# Patient Record
Sex: Female | Born: 1955 | Hispanic: No | Marital: Single | Smoking: Never smoker
Health system: Southern US, Community
[De-identification: ages and names within clinical notes are randomized; demographics above are authoritative.]

## PROBLEM LIST (undated history)

## (undated) DIAGNOSIS — R51 Headache: Secondary | ICD-10-CM

## (undated) DIAGNOSIS — E559 Vitamin D deficiency, unspecified: Secondary | ICD-10-CM

## (undated) DIAGNOSIS — Z8719 Personal history of other diseases of the digestive system: Secondary | ICD-10-CM

## (undated) DIAGNOSIS — C439 Malignant melanoma of skin, unspecified: Secondary | ICD-10-CM

## (undated) DIAGNOSIS — M545 Low back pain, unspecified: Secondary | ICD-10-CM

## (undated) DIAGNOSIS — R519 Headache, unspecified: Secondary | ICD-10-CM

## (undated) DIAGNOSIS — T4145XA Adverse effect of unspecified anesthetic, initial encounter: Secondary | ICD-10-CM

## (undated) DIAGNOSIS — K219 Gastro-esophageal reflux disease without esophagitis: Secondary | ICD-10-CM

## (undated) DIAGNOSIS — R12 Heartburn: Secondary | ICD-10-CM

## (undated) DIAGNOSIS — F419 Anxiety disorder, unspecified: Secondary | ICD-10-CM

## (undated) DIAGNOSIS — M51369 Other intervertebral disc degeneration, lumbar region without mention of lumbar back pain or lower extremity pain: Secondary | ICD-10-CM

## (undated) DIAGNOSIS — R011 Cardiac murmur, unspecified: Secondary | ICD-10-CM

## (undated) DIAGNOSIS — R319 Hematuria, unspecified: Secondary | ICD-10-CM

## (undated) DIAGNOSIS — Z9289 Personal history of other medical treatment: Secondary | ICD-10-CM

## (undated) DIAGNOSIS — G4700 Insomnia, unspecified: Secondary | ICD-10-CM

## (undated) DIAGNOSIS — M542 Cervicalgia: Secondary | ICD-10-CM

## (undated) DIAGNOSIS — T8859XA Other complications of anesthesia, initial encounter: Secondary | ICD-10-CM

## (undated) DIAGNOSIS — M5136 Other intervertebral disc degeneration, lumbar region: Secondary | ICD-10-CM

## (undated) DIAGNOSIS — N83209 Unspecified ovarian cyst, unspecified side: Secondary | ICD-10-CM

## (undated) DIAGNOSIS — I10 Essential (primary) hypertension: Secondary | ICD-10-CM

## (undated) HISTORY — DX: Personal history of other medical treatment: Z92.89

## (undated) HISTORY — DX: Insomnia, unspecified: G47.00

## (undated) HISTORY — DX: Cervicalgia: M54.2

## (undated) HISTORY — DX: Anxiety disorder, unspecified: F41.9

## (undated) HISTORY — PX: OTHER SURGICAL HISTORY: SHX169

## (undated) HISTORY — DX: Low back pain: M54.5

## (undated) HISTORY — PX: ECTOPIC PREGNANCY SURGERY: SHX613

## (undated) HISTORY — DX: Unspecified ovarian cyst, unspecified side: N83.209

## (undated) HISTORY — DX: Other intervertebral disc degeneration, lumbar region: M51.36

## (undated) HISTORY — DX: Other intervertebral disc degeneration, lumbar region without mention of lumbar back pain or lower extremity pain: M51.369

## (undated) HISTORY — DX: Low back pain, unspecified: M54.50

## (undated) HISTORY — DX: Hematuria, unspecified: R31.9

## (undated) HISTORY — DX: Heartburn: R12

## (undated) HISTORY — DX: Vitamin D deficiency, unspecified: E55.9

---

## 1980-08-02 HISTORY — PX: SALPINGECTOMY: SHX328

## 2004-08-05 ENCOUNTER — Ambulatory Visit: Payer: Self-pay | Admitting: Otolaryngology

## 2005-03-17 ENCOUNTER — Ambulatory Visit: Payer: Self-pay | Admitting: Family Medicine

## 2005-03-31 ENCOUNTER — Ambulatory Visit: Payer: Self-pay | Admitting: Family Medicine

## 2005-05-24 ENCOUNTER — Ambulatory Visit: Payer: Self-pay | Admitting: Urology

## 2006-06-28 ENCOUNTER — Ambulatory Visit: Payer: Self-pay

## 2007-01-04 ENCOUNTER — Ambulatory Visit: Payer: Self-pay | Admitting: Internal Medicine

## 2007-03-30 ENCOUNTER — Ambulatory Visit: Payer: Self-pay | Admitting: Emergency Medicine

## 2007-07-19 ENCOUNTER — Ambulatory Visit: Payer: Self-pay | Admitting: Family Medicine

## 2007-10-22 ENCOUNTER — Ambulatory Visit: Payer: Self-pay | Admitting: Internal Medicine

## 2008-01-23 ENCOUNTER — Ambulatory Visit: Payer: Self-pay | Admitting: Unknown Physician Specialty

## 2008-07-09 ENCOUNTER — Ambulatory Visit: Payer: Self-pay | Admitting: Internal Medicine

## 2009-01-16 ENCOUNTER — Ambulatory Visit: Payer: Self-pay | Admitting: Internal Medicine

## 2009-03-18 ENCOUNTER — Ambulatory Visit: Payer: Self-pay | Admitting: Internal Medicine

## 2009-08-04 ENCOUNTER — Ambulatory Visit: Payer: Self-pay | Admitting: Family Medicine

## 2010-07-21 ENCOUNTER — Ambulatory Visit: Payer: Self-pay | Admitting: Family Medicine

## 2010-12-02 ENCOUNTER — Ambulatory Visit: Payer: Self-pay | Admitting: Family Medicine

## 2011-02-19 ENCOUNTER — Ambulatory Visit: Payer: Self-pay | Admitting: Family Medicine

## 2011-06-17 ENCOUNTER — Ambulatory Visit: Payer: Self-pay | Admitting: Internal Medicine

## 2011-10-19 ENCOUNTER — Emergency Department: Payer: Self-pay | Admitting: Emergency Medicine

## 2012-01-11 ENCOUNTER — Ambulatory Visit: Payer: Self-pay | Admitting: Internal Medicine

## 2012-04-06 ENCOUNTER — Ambulatory Visit: Payer: Self-pay | Admitting: Family Medicine

## 2012-07-13 ENCOUNTER — Ambulatory Visit: Payer: Self-pay

## 2012-07-13 ENCOUNTER — Observation Stay: Payer: Self-pay | Admitting: Internal Medicine

## 2012-07-13 LAB — COMPREHENSIVE METABOLIC PANEL
Alkaline Phosphatase: 76 U/L (ref 50–136)
Anion Gap: 5 — ABNORMAL LOW (ref 7–16)
BUN: 13 mg/dL (ref 7–18)
Calcium, Total: 8.7 mg/dL (ref 8.5–10.1)
Chloride: 106 mmol/L (ref 98–107)
Co2: 28 mmol/L (ref 21–32)
EGFR (African American): >60
EGFR (Non-African Amer.): >60
Glucose: 91 mg/dL (ref 65–99)
Potassium: 3.6 mmol/L (ref 3.5–5.1)
SGOT(AST): 29 U/L (ref 15–37)
SGPT (ALT): 32 U/L (ref 12–78)

## 2012-07-13 LAB — CBC WITH DIFFERENTIAL/PLATELET
Basophil %: 0.7 %
Eosinophil %: 4.2 %
HGB: 13.2 g/dL (ref 12.0–16.0)
Lymphocyte #: 4.3 10*3/uL — ABNORMAL HIGH (ref 1.0–3.6)
Lymphocyte %: 44.1 %
MCH: 28.7 pg (ref 26.0–34.0)
Monocyte #: 0.8 x10 3/mm (ref 0.2–0.9)
Monocyte %: 8.7 %
Neutrophil #: 4.1 10*3/uL (ref 1.4–6.5)
Neutrophil %: 42.3 %
Platelet: 298 10*3/uL (ref 150–440)
RBC: 4.6 10*6/uL (ref 3.80–5.20)
WBC: 9.7 10*3/uL (ref 3.6–11.0)

## 2012-07-13 LAB — TROPONIN I: Troponin-I: <0.02 ng/mL

## 2012-07-13 LAB — CK TOTAL AND CKMB (NOT AT ARMC)
CK, Total: 87 U/L (ref 21–215)
CK-MB: 0.7 ng/mL (ref 0.5–3.6)

## 2012-07-14 LAB — TROPONIN I: Troponin-I: <0.02 ng/mL

## 2012-09-20 DIAGNOSIS — M75100 Unspecified rotator cuff tear or rupture of unspecified shoulder, not specified as traumatic: Secondary | ICD-10-CM | POA: Insufficient documentation

## 2013-02-09 ENCOUNTER — Ambulatory Visit: Payer: Self-pay | Admitting: General Practice

## 2013-05-31 DIAGNOSIS — I10 Essential (primary) hypertension: Secondary | ICD-10-CM | POA: Insufficient documentation

## 2013-06-05 ENCOUNTER — Ambulatory Visit: Payer: Self-pay | Admitting: Family Medicine

## 2013-12-12 DIAGNOSIS — E559 Vitamin D deficiency, unspecified: Secondary | ICD-10-CM | POA: Insufficient documentation

## 2014-01-24 ENCOUNTER — Ambulatory Visit: Payer: Self-pay | Admitting: Physician Assistant

## 2014-05-10 ENCOUNTER — Ambulatory Visit: Payer: Self-pay | Admitting: Orthopedic Surgery

## 2014-05-30 DIAGNOSIS — Z9289 Personal history of other medical treatment: Secondary | ICD-10-CM

## 2014-05-30 HISTORY — DX: Personal history of other medical treatment: Z92.89

## 2014-05-30 LAB — HM PAP SMEAR: HM PAP: NEGATIVE

## 2014-06-30 ENCOUNTER — Ambulatory Visit: Payer: Self-pay | Admitting: Physician Assistant

## 2014-06-30 LAB — RAPID STREP-A WITH REFLX: Micro Text Report: NEGATIVE

## 2014-06-30 LAB — RAPID INFLUENZA A&B ANTIGENS

## 2014-07-03 LAB — BETA STREP CULTURE(ARMC)

## 2014-07-24 ENCOUNTER — Ambulatory Visit: Payer: Self-pay | Admitting: Family Medicine

## 2014-07-30 ENCOUNTER — Ambulatory Visit: Payer: Self-pay | Admitting: Family Medicine

## 2014-08-02 HISTORY — PX: NECK SURGERY: SHX720

## 2014-09-16 ENCOUNTER — Ambulatory Visit: Payer: Self-pay

## 2014-11-19 NOTE — Consult Note (Signed)
PATIENT NAME:  Emily Burgess, Emily Burgess MR#:  [id number] DATE OF BIRTH:  05-17-1956  DATE OF CONSULTATION:  07/13/2012  REFERRING PHYSICIAN:  Dr. Harrel Lemon  CONSULTING PHYSICIAN:  Corey Skains, MD  PRIMARY CARE PHYSICIAN: Dr. Ronnald Ramp  REASON FOR CONSULTATION: Unstable angina, bradycardia and hypertension.   CHIEF COMPLAINT: "I have chest pain."   HISTORY OF PRESENT ILLNESS: This is a 59 year old female with hypertension who has had new onset of substernal chest discomfort radiating into her back and left arm multiple times in the last many days for which she is also awakened this morning with chest pain, shortness of breath and weakness and dizziness. The patient has had relief of that at this time. EKG has shown sinus bradycardia, otherwise normal EKG. The patient has had reasonable blood pressure at this time. Troponin, CK-MB so far are within normal limits. There has been no other cardiovascular symptoms at this time.   REVIEW OF SYSTEMS: Reminder review of systems negative for vision change, ringing in the ears, hearing loss, cough, congestion, heartburn, nausea, vomiting, diarrhea, bloody stool, stomach pain, extremity pain, leg weakness, cramping of the buttocks, known blood clots, headaches, blackouts, nosebleeds, congestion, trouble swallowing, frequent urination, urination at night, muscle weakness, numbness, anxiety, depression, skin lesions, skin rashes.   PAST MEDICAL HISTORY:  1. Hypertension.  2. Hyperlipidemia.   FAMILY HISTORY: Father had some coronary disease at an early age.   SOCIAL HISTORY: Currently denies alcohol or tobacco use.   ALLERGIES: No known drug allergies.   CURRENT MEDICATIONS: As listed.   PHYSICAL EXAMINATION:  VITAL SIGNS: Blood pressure 139/68 bilaterally, heart rate is 58 upright, reclining, and regular.   GENERAL: She is a well appearing female in no acute distress.   HEENT: No icterus, thyromegaly, ulcers, hemorrhage, or xanthelasma.    CARDIOVASCULAR: Regular rate and rhythm with normal S1 and S2 without murmur, gallop, or rub. Point of maximal impulse is normal size and placement. Carotid upstroke normal without bruit. Jugular venous pressure normal.   LUNGS: Lungs have few basilar crackles with normal respirations.   ABDOMEN: Soft, nontender without hepatosplenomegaly or masses. Abdominal aorta is normal size without bruit.   EXTREMITIES: 2+ bilateral pulses in dorsal, pedal, radial, and femoral arteries without lower extremity edema, cyanosis, clubbing, ulcers.   NEUROLOGIC: She is oriented to time, place, and person with normal mood and affect.   ASSESSMENT: 59 year old female with hypertension, hyperlipidemia, new onset of chest pain, shortness of breath without evidence of myocardial infarction consistent with unstable angina and bradycardia needing further treatment options.   RECOMMENDATIONS:  1. Decrease dose of atenolol to reduce concerns of bradycardia.  2. Continue hypertension control with lisinopril.  3. Serial ECG and enzymes to assess for myocardial infarction.  4. Treadmill EKG to assess exercise tolerance, myocardial ischemia.  5. Continue aspirin for further risk reduction and potentially use nitroglycerin as necessary.  6. Further treatment options after above.   ____________________________ Corey Skains, MD bjk:cms D: 07/13/2012 17:00:54 ET T: 07/14/2012 06:54:35 ET  JOB#: [id number] cc: Corey Skains, MD, <Dictator>  Corey Skains MD ELECTRONICALLY SIGNED 07/25/2012 7:48

## 2014-11-19 NOTE — H&P (Signed)
PATIENT NAME:  Emily Burgess, Emily Burgess MR#:  [id number] DATE OF BIRTH:  1956-04-29  DATE OF ADMISSION:  07/13/2012  CHIEF COMPLAINT: Dizziness and chest pain.   HISTORY OF PRESENT ILLNESS: This is a 59 year old female who yesterday saw her primary care doctor for a routine check-up. She did mention to her primary care doctor that she had had chest pain during the week with numbness down her left arm and some shortness of breath. Since then she also had some dizziness episodes where she felt lightheaded like she was going to pass out, complained of palpitations during that time. EKG done in the office showed some possible abnormalities and some T waves. She was set up to see Dr. Nehemiah Massed today, but before that she had three episodes today of having palpitations, lightheadedness and presyncopal-type feelings. Here in the ER, her EKG is unremarkable, but with this symptomatology for concern of either coronary artery disease or some type of arrhythmia, so we are going to observe her.   PAST MEDICAL HISTORY: Hypertension.   PAST SURGICAL HISTORY: None.   ALLERGIES: No known drug allergies.   CURRENT MEDICATIONS:  1. Atenolol 100 mg daily.  2. Hydrochlorothiazide 25 mg daily.   SOCIAL HISTORY: Does not smoke or drink alcohol.   FAMILY HISTORY: Significant for father who had a myocardial infarction in his 69s and hypertension.   REVIEW OF SYSTEMS: CONSTITUTIONAL: No fever or chills. EYES: No blurred vision. ENT: No hearing loss. CARDIOVASCULAR: No chest pain. PULMONARY: No shortness of breath. GI: No nausea, vomiting, or diarrhea. GU: No dysuria. ENDOCRINE: No heat or cold intolerance. INTEGUMENT: No rash. MUSCULOSKELETAL: Occasional joint pain. NEUROLOGIC: No numbness or weakness.   PHYSICAL EXAMINATION:   VITAL SIGNS: Blood pressure is 122/67, temperature 98.1, pulse 59, and respirations 16.   GENERAL: This is a well nourished white female in no acute distress.   HEENT: The pupils are equal, round, and  reactive to light. Sclerae anicteric. Oral mucosa is moist. Oropharynx is clear. Nasopharynx is clear.   NECK: Supple. No JVD, lymphadenopathy, or thyromegaly.   CARDIOVASCULAR: Regular rate and rhythm. No murmurs, rubs, or gallops.   LUNGS: Clear to auscultation. No dullness to percussion. She is not using accessory muscles.   ABDOMEN: Soft, nontender, and nondistended. Bowel sounds are positive. No hepatosplenomegaly. No masses.   EXTREMITIES: There is no edema.   NEUROLOGIC: Cranial nerves II through XII are intact. She is alert and oriented x 4.   SKIN: Moist with no rash.   LABS/STUDIES: EKG shows normal sinus rhythm.  Troponin is less than 0.2.   ASSESSMENT AND PLAN:  1. Chest pain. She did have this episode yesterday. EKG is unremarkable, but would have to be suspicious with this other symptomatology about some possible coronary artery disease. We will go ahead and give her aspirin, continue her beta blocker and rule her out. Consider some type of functional study in the morning.  2. Presyncope. I am suspicious this could be an arrhythmia so would like to keep her on a monitor under observation for 24 hours. We will consult cardiology for possible stress EKG with treadmill in the morning.  3. Hypertension. We will continue her current medications.  4. Shortness of breath. Suspect this could be cardiac in nature; however, if this rules out we will consider some type of pulmonary work-up as an outpatient.   TIME SPENT ON ADMISSION: 45 minutes. ____________________________ Baxter Hire, MD jdj:slb D: 07/13/2012 16:35:53 ET T: 07/13/2012 17:32:59 ET JOB#: [id number]  cc: Baxter Hire, MD, <Dictator> Juline Patch, MD Corey Skains, MD Baxter Hire MD ELECTRONICALLY SIGNED 07/14/2012 14:59

## 2014-11-19 NOTE — Discharge Summary (Signed)
PATIENT NAME:  Emily Burgess, Emily Burgess MR#:  [id number] DATE OF BIRTH:  [birth year]  DATE OF ADMISSION:  07/13/2012 DATE OF DISCHARGE:  07/14/2012  PRIMARY CARE PHYSICIAN:  Dr. Otilio Miu  DISCHARGE DIAGNOSES:  1. Chest pain, negative stress test.  2. Hypertension.   HISTORY OF PRESENTING ILLNESS: The patient is a 59 year old female, saw her primary care physician on the previous day for a routine checkup. She did mention to her primary care doctor that she had chest pain during the week with numbness down her left arm and some shortness of breath then. She also had some dizziness episode where she felt lightheaded and was going to pass out. She complained of palpitations during that time. EKG was done in the office and showed some possible abnormalities in the T waves. She was set up to see Dr. Nehemiah Massed, but before that she had three episodes of having palpitations, lightheadedness and presyncopal-type feeling; and so she was sent in the Emergency Room. EKG was unremarkable, and so there was concern of having either arrhythmia or coronary artery disease, and so she was admitted for observation on telemetry floor.   HOSPITAL COURSE: Chest pain. She was started on aspirin, beta blocker, and to do further study next day morning. For presyncopal episode and possibility of arrhythmia, she was monitored on telemetry monitoring and cardiology consult to be done in the morning. For 24 hours on monitoring on telemetry, she remained without any event. Her troponins remained negative, and a stress test was done as per the recommendation by Cardiology in consult, and that was negative. So, she was discharged home with advise to follow up with her primary care physician and reassure that there is no significant coronary artery disease.   Other issue addressed during the hospital stay was hypertension. She was continued on her home medication, lisinopril, and we decreased the dose of atenolol and she was found having some  bradycardia in the range of 50, and she was advised to take decreased dose of atenolol than what she was taking at home.   Important lab results during the hospital stay: All three troponins remained negative, less than 0.02. Chest x-ray: No acute cardiopulmonary disease. BUN 13, creatinine 0.79, glucose 91, potassium 3.6.    Stress test was done and was reviewed by Dr. Nehemiah Massed, cardiologist, who did the consult on the patient; and after this was done, he informs in his notes that the stress test is without any ischemic changes or chest pain. So, from a cardiac point of view he advised her to go home with decreased dose of atenolol and follow up in 2 weeks.   CONSULTS: Cardiology consult was done with Dr. Nehemiah Massed.   CONDITION ON DISCHARGE: Stable.   CODE STATUS:  FULL CODE.    DISCHARGE MEDICATIONS: Lisinopril 10 mg daily, atenolol 50 mg daily.   DIET: Advised low sodium and regular consistency.   ACTIVITY LIMITATION: None.   TIMEFRAME TO FOLLOW UP: One to 2 weeks with Otilio Miu, family physician, [place], [address] [place], [place].   TOTAL TIME SPENT IN DISCHARGE: 45 minutes  ____________________________ Ceasar Lund. Anselm Jungling, MD vgv:cb D: 07/16/2012 18:41:37 ET T: 07/17/2012 14:07:20 ET JOB#: [id number]  cc: Ceasar Lund. Anselm Jungling, MD, <Dictator> Juline Patch, MD Vaughan Basta MD ELECTRONICALLY SIGNED 07/17/2012 23:01

## 2015-01-23 ENCOUNTER — Other Ambulatory Visit: Payer: Self-pay | Admitting: Family Medicine

## 2015-01-23 DIAGNOSIS — R3129 Other microscopic hematuria: Secondary | ICD-10-CM

## 2015-01-30 ENCOUNTER — Other Ambulatory Visit: Payer: Self-pay

## 2015-02-06 ENCOUNTER — Ambulatory Visit
Admission: RE | Admit: 2015-02-06 | Discharge: 2015-02-06 | Disposition: A | Discharge status: Home / Self Care | Payer: PRIVATE HEALTH INSURANCE | Source: Ambulatory Visit | Attending: Family Medicine | Admitting: Family Medicine

## 2015-02-06 DIAGNOSIS — R312 Other microscopic hematuria: Secondary | ICD-10-CM | POA: Insufficient documentation

## 2015-02-06 DIAGNOSIS — R3129 Other microscopic hematuria: Secondary | ICD-10-CM

## 2015-02-06 HISTORY — DX: Malignant melanoma of skin, unspecified: C43.9

## 2015-02-06 HISTORY — DX: Essential (primary) hypertension: I10

## 2015-02-06 MED ORDER — IOHEXOL 300 MG/ML  SOLN
125.0000 mL | Freq: Once | INTRAMUSCULAR | Status: AC | PRN
  Administered 2015-02-06: 125 mL via INTRAVENOUS

## 2015-06-03 DIAGNOSIS — M542 Cervicalgia: Secondary | ICD-10-CM

## 2015-06-03 HISTORY — DX: Cervicalgia: M54.2

## 2015-06-15 ENCOUNTER — Emergency Department
Admission: EM | Admit: 2015-06-15 | Discharge: 2015-06-15 | Disposition: A | Discharge status: Home / Self Care | Payer: Worker's Compensation | Attending: Emergency Medicine | Admitting: Emergency Medicine

## 2015-06-15 ENCOUNTER — Encounter: Payer: Self-pay | Admitting: Emergency Medicine

## 2015-06-15 DIAGNOSIS — Y990 Civilian activity done for income or pay: Secondary | ICD-10-CM | POA: Diagnosis not present

## 2015-06-15 DIAGNOSIS — Z7721 Contact with and (suspected) exposure to potentially hazardous body fluids: Secondary | ICD-10-CM | POA: Diagnosis present

## 2015-06-15 DIAGNOSIS — I10 Essential (primary) hypertension: Secondary | ICD-10-CM | POA: Insufficient documentation

## 2015-06-15 DIAGNOSIS — Y9389 Activity, other specified: Secondary | ICD-10-CM | POA: Diagnosis not present

## 2015-06-15 DIAGNOSIS — Y92149 Unspecified place in prison as the place of occurrence of the external cause: Secondary | ICD-10-CM | POA: Insufficient documentation

## 2015-06-15 DIAGNOSIS — S39012A Strain of muscle, fascia and tendon of lower back, initial encounter: Secondary | ICD-10-CM

## 2015-06-15 MED ORDER — IBUPROFEN 600 MG PO TABS
600.0000 mg | ORAL_TABLET | Freq: Once | ORAL | Status: AC
  Administered 2015-06-15: 600 mg via ORAL
  Filled 2015-06-15: qty 1
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
## 2015-06-15 NOTE — Discharge Instructions (Signed)
At this time, there is no evidence of significant pathogen exposure you and you have declined blood work which is not unreasonable. If you have any new or worrisome symptoms please return to the emergency department.

## 2015-06-15 NOTE — ED Notes (Signed)
NAD
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
## 2015-08-08 ENCOUNTER — Encounter: Payer: Self-pay | Admitting: Physician Assistant

## 2015-08-08 ENCOUNTER — Ambulatory Visit: Payer: Self-pay | Admitting: Physician Assistant

## 2015-08-08 VITALS — BP 110/70 | HR 77 | Temp 98.5°F

## 2015-08-08 DIAGNOSIS — M542 Cervicalgia: Secondary | ICD-10-CM

## 2015-08-08 MED ORDER — METHYLPREDNISOLONE 4 MG PO TBPK: ORAL_TABLET | ORAL | Status: DC

## 2015-08-08 MED ORDER — CYCLOBENZAPRINE HCL 10 MG PO TABS: 10.0000 mg | ORAL_TABLET | Freq: Three times a day (TID) | ORAL | Status: DC | PRN
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
## 2015-08-29 ENCOUNTER — Ambulatory Visit: Payer: Self-pay | Admitting: Physician Assistant

## 2015-08-29 ENCOUNTER — Ambulatory Visit
Admission: RE | Admit: 2015-08-29 | Discharge: 2015-08-29 | Disposition: A | Discharge status: Home / Self Care | Payer: Managed Care, Other (non HMO) | Source: Ambulatory Visit | Attending: Physician Assistant | Admitting: Physician Assistant

## 2015-08-29 ENCOUNTER — Encounter: Payer: Self-pay | Admitting: Physician Assistant

## 2015-08-29 VITALS — BP 120/70 | HR 87 | Temp 98.4°F

## 2015-08-29 DIAGNOSIS — M5030 Other cervical disc degeneration, unspecified cervical region: Secondary | ICD-10-CM | POA: Insufficient documentation

## 2015-08-29 DIAGNOSIS — M542 Cervicalgia: Secondary | ICD-10-CM

## 2015-08-29 MED ORDER — METHOCARBAMOL 750 MG PO TABS: 750.0000 mg | ORAL_TABLET | Freq: Four times a day (QID) | ORAL | Status: DC

## 2015-08-29 NOTE — Progress Notes (Signed)
S- Patient c/o 3 weeks of neck pain. Current being treated for cervical strain.  Patient  unable to take Flexeril as directed 2nd to side affects. Take Flexeril only when not working to avoid sleeping at work. O-   Moderate  distress. No spinal deformity.  Guarding with palpation at c5-6. Spasms with flexion and lateral movements. F/E ROM upper extremities. A- Acute neck pain. P- Discontinue  Flexeril, start Robaxin.  C-Spine X-ray.  Follow up in 2-3 days.

## 2015-09-12 ENCOUNTER — Other Ambulatory Visit: Payer: Self-pay | Admitting: Orthopedic Surgery

## 2015-09-12 DIAGNOSIS — M5412 Radiculopathy, cervical region: Secondary | ICD-10-CM

## 2015-09-20 ENCOUNTER — Ambulatory Visit
Admission: RE | Admit: 2015-09-20 | Discharge: 2015-09-20 | Disposition: A | Discharge status: Home / Self Care | Payer: Managed Care, Other (non HMO) | Source: Ambulatory Visit | Attending: Orthopedic Surgery | Admitting: Orthopedic Surgery

## 2015-09-20 DIAGNOSIS — M5412 Radiculopathy, cervical region: Secondary | ICD-10-CM

## 2015-10-01 DIAGNOSIS — N83209 Unspecified ovarian cyst, unspecified side: Secondary | ICD-10-CM | POA: Insufficient documentation

## 2015-10-21 ENCOUNTER — Encounter: Payer: Self-pay | Admitting: *Deleted

## 2015-10-21 ENCOUNTER — Ambulatory Visit: Payer: Managed Care, Other (non HMO) | Admitting: Urology

## 2015-10-29 ENCOUNTER — Ambulatory Visit (INDEPENDENT_AMBULATORY_CARE_PROVIDER_SITE_OTHER): Payer: Managed Care, Other (non HMO) | Admitting: Urology

## 2015-10-29 ENCOUNTER — Encounter: Payer: Self-pay | Admitting: Urology

## 2015-10-29 VITALS — BP 120/77 | HR 85 | Ht 64.0 in | Wt 222.2 lb

## 2015-10-29 DIAGNOSIS — R3129 Other microscopic hematuria: Secondary | ICD-10-CM | POA: Diagnosis not present

## 2015-10-29 DIAGNOSIS — N949 Unspecified condition associated with female genital organs and menstrual cycle: Secondary | ICD-10-CM

## 2015-10-29 LAB — URINALYSIS, COMPLETE
BILIRUBIN UA: NEGATIVE
GLUCOSE, UA: NEGATIVE
Leukocytes, UA: NEGATIVE
NITRITE UA: NEGATIVE
Protein, UA: NEGATIVE
Specific Gravity, UA: >1.03 — ABNORMAL HIGH (ref 1.005–1.030)
UUROB: 0.2 mg/dL (ref 0.2–1.0)
pH, UA: 5.5 (ref 5.0–7.5)

## 2015-10-29 LAB — MICROSCOPIC EXAMINATION

## 2015-10-29 NOTE — Progress Notes (Signed)
10/29/2015 3:40 PM   Emily Burgess July 23, 1956 [id number]  Referring provider: Gayland Curry, MD [address] [place], [place] [address]  Chief Complaint  Patient presents with  . Hematuria    referred by Dr. Astrid Divine    HPI: Patient is a 60 year old Caucasian female with a history of persistent microscopic hematuria who is referred by her PCP, Dr. Astrid Divine for further evaluation and management.  Patient states that she has been told for the last 2 years that she has blood in her urine.  She denies gross hematuria, flank/suprapubic pain, dysuria, a history of nephrolithiasis or a history of recurrent urinary tract infections.  She denies any family history of nephrolithiasis or hematuria.    She does not have a personal history of GU malignancy and states she has a cousin with prostate cancer.    Her baseline urinary symptoms are frequency and nocturia.  Her UA today remains positive for microscopic hematuria with 3-10 rbc's per high-power field.   She did undergo a CT urogram in July 2016 ordered by Dr. Iona Beard that did not find any etiology for her microscopic hematuria.  She is very concerned that she still has blood in her urine today and would like the exam repeated.   PMH: Past Medical History  Diagnosis Date  . Melanoma ([place])     10 years ago resected from mid chest area.  . Hypertension   . Insomnia   . Anxiety   . Hematuria     microscopic  . Ovarian cyst   . Heartburn     Surgical History: Past Surgical History  Procedure Laterality Date  . Atopic pregnancy    . Cesarean section  1980    Home Medications:    Medication List       This list is accurate as of: 10/29/15  3:40 PM.  Always use your most recent med list.               aspirin EC 81 MG tablet  Take by mouth.     atenolol 50 MG tablet  Commonly known as:  TENORMIN  TAKE 1 TABLET (50 MG TOTAL) BY MOUTH ONCE DAILY.     cyclobenzaprine 10 MG tablet  Commonly known as:   FLEXERIL  Take 1 tablet (10 mg total) by mouth 3 (three) times daily as needed for muscle spasms.     gabapentin 300 MG capsule  Commonly known as:  NEURONTIN  Take 300 mg by mouth every evening.     hydrOXYzine 25 MG tablet  Commonly known as:  ATARAX/VISTARIL  Reported on 10/29/2015     lisinopril-hydrochlorothiazide 10-12.5 MG tablet  Commonly known as:  PRINZIDE,ZESTORETIC  Take 1 tablet by mouth daily.     methocarbamol 750 MG tablet  Commonly known as:  ROBAXIN-750  Take 1 tablet (750 mg total) by mouth 4 (four) times daily.     methylPREDNISolone 4 MG Tbpk tablet  Commonly known as:  MEDROL DOSEPAK  See admin instructions. Reported on 10/29/2015     traZODone 50 MG tablet  Commonly known as:  DESYREL  Reported on 10/29/2015        Allergies: No Known Allergies  Family History: Family History  Problem Relation Age of Onset  . Kidney disease Neg Hx   . Bladder Cancer Neg Hx   . Prostate cancer Cousin     Social History:  reports that she has never smoked. She does not have any smokeless tobacco history on  file. She reports that she does not drink alcohol or use illicit drugs.  ROS: UROLOGY Frequent Urination?: Yes Hard to postpone urination?: No Burning/pain with urination?: No Get up at night to urinate?: Yes Leakage of urine?: No Urine stream starts and stops?: No Trouble starting stream?: No Do you have to strain to urinate?: No Blood in urine?: Yes Urinary tract infection?: No Sexually transmitted disease?: No Injury to kidneys or bladder?: No Painful intercourse?: No Weak stream?: No Currently pregnant?: No Vaginal bleeding?: No Last menstrual period?: n  Gastrointestinal Nausea?: No Vomiting?: No Indigestion/heartburn?: Yes Diarrhea?: No Constipation?: No  Constitutional Fever: No Night sweats?: Yes Weight loss?: No Fatigue?: Yes  Skin Skin rash/lesions?: No Itching?: Yes  Eyes Blurred vision?: Yes Double vision?:  No  Ears/Nose/Throat Sore throat?: No Sinus problems?: Yes  Hematologic/Lymphatic Swollen glands?: No Easy bruising?: No  Cardiovascular Leg swelling?: No Chest pain?: No  Respiratory Cough?: No Shortness of breath?: No  Endocrine Excessive thirst?: Yes  Musculoskeletal Back pain?: Yes Joint pain?: Yes  Neurological Headaches?: No Dizziness?: Yes  Psychologic Depression?: No Anxiety?: Yes  Physical Exam: BP 120/77 mmHg  Pulse 85  Ht 5\' 4"  (1.626 m)  Wt 222 lb 3.2 oz (100.789 kg)  BMI 38.12 kg/m2  Constitutional: Well nourished. Alert and oriented, No acute distress. HEENT: [place] AT, moist mucus membranes. Trachea midline, no masses. Cardiovascular: No clubbing, cyanosis, or edema. Respiratory: Normal respiratory effort, no increased work of breathing. GI: Abdomen is soft, non tender, non distended, no abdominal masses. Liver and spleen not palpable.  No hernias appreciated.  Stool sample for occult testing is not indicated.   GU: No CVA tenderness.  No bladder fullness or masses.  Normal external genitalia, normal pubic hair distribution, no lesions.  Normal urethral meatus, no lesions, no prolapse, no discharge.   No urethral masses, tenderness and/or tenderness. No bladder fullness, tenderness or masses. Normal vagina mucosa, good estrogen effect, no discharge, no lesions, good pelvic support, no cystocele or rectocele noted.  No cervical motion tenderness.  Uterus is freely mobile and non-fixed.  No adnexal/parametria masses or tenderness noted.  Anus and perineum are without rashes or lesions.    Skin: No rashes, bruises or suspicious lesions. Lymph: No cervical or inguinal adenopathy. Neurologic: Grossly intact, no focal deficits, moving all 4 extremities. Psychiatric: Normal mood and affect.  Laboratory Data: Lab Results  Component Value Date   WBC 9.7 07/13/2012   HGB 13.2 07/13/2012   HCT 39.1 07/13/2012   MCV 85 07/13/2012   PLT 298 07/13/2012     Lab Results  Component Value Date   CREATININE 0.79 07/13/2012    Lab Results  Component Value Date   AST 29 07/13/2012   Lab Results  Component Value Date   ALT 32 07/13/2012    Urinalysis Microscopic Examination  Result Value Ref Range   WBC, UA 0-5 0 -  5 /hpf   RBC, UA 3-10 (A) 0 -  2 /hpf   Epithelial Cells (non renal) 0-10 0 - 10 /hpf   Mucus, UA Present (A) Not Estab.   Bacteria, UA Few (A) None seen/Few  Urinalysis, Complete  Result Value Ref Range   Specific Gravity, UA >1.030 (H) 1.005 - 1.030   pH, UA 5.5 5.0 - 7.5   Color, UA Yellow Yellow   Appearance Ur Clear Clear   Leukocytes, UA Negative Negative   Protein, UA Negative Negative/Trace   Glucose, UA Negative Negative   Ketones, UA Trace (A) Negative  RBC, UA Trace (A) Negative   Bilirubin, UA Negative Negative   Urobilinogen, Ur 0.2 0.2 - 1.0 mg/dL   Nitrite, UA Negative Negative   Microscopic Examination See below:    Pertinent Imaging: CLINICAL DATA: 60 year old female with prior history of microscopic hematuria found on physical examination 2 years ago. Recent urinary tract infection 2 weeks ago. Urinary frequency and urinary discharge. Persistent microscopic hematuria on recent examination.  EXAM: CT ABDOMEN AND PELVIS WITHOUT AND WITH CONTRAST  TECHNIQUE: Multidetector CT imaging of the abdomen and pelvis was performed following the standard protocol before and following the bolus administration of intravenous contrast.  CONTRAST: 168mL OMNIPAQUE IOHEXOL 300 MG/ML SOLN  COMPARISON: CT of the abdomen and pelvis 05/24/2005.  FINDINGS: Lower chest: Small hiatal hernia.  Hepatobiliary: No cystic or solid hepatic lesions. No intra or extrahepatic biliary ductal dilatation. Gallbladder is normal in appearance.  Pancreas: No pancreatic mass. No pancreatic ductal dilatation. No pancreatic or peripancreatic fluid or inflammatory changes.  Spleen:  Unremarkable.  Adrenals/Urinary Tract: Bilateral adrenal glands are normal in appearance. Precontrast images demonstrate no calcifications within the collecting system of either kidney, along the course of either ureter, or within the lumen of the urinary bladder. Post contrast images demonstrate no focal solid renal lesions. Post contrast delayed images demonstrate no definite filling defects within the collecting system of either kidney, along the course of either ureter, or within the lumen of the urinary bladder to strongly suggest the presence of a urothelial neoplasm. Urinary bladder is normal in appearance.  Stomach/Bowel: Normal appearance of the stomach. No pathologic dilatation of small bowel or colon. Normal appendix.  Vascular/Lymphatic: No significant atherosclerotic disease, aneurysm or dissection identified in the abdominal or pelvic vasculature. No lymphadenopathy noted in the abdomen or pelvis.  Reproductive: Uterus and right ovary are unremarkable in appearance. 3.1 x 2.8 x 5.3 cm well-defined low-attenuation lesion in the left adnexa is likely an ovarian cyst.  Other: No significant volume of ascites. No pneumoperitoneum.  Musculoskeletal: There are no aggressive appearing lytic or blastic lesions noted in the visualized portions of the skeleton.  IMPRESSION: 1. No explanation for the patient's history of microhematuria. 2. No acute findings in the abdomen or pelvis. 3. Normal appendix. 4. 3.1 x 2.8 x 5.3 cm well-defined low-attenuation the left adnexal lesion likely represents an ovarian cyst. However, further evaluation with nonemergent pelvic ultrasound is recommended at this time in this post menopausal patient to ensure the benign nature of this finding. This recommendation follows ACR consensus guidelines: White Paper of the ACR Incidental Findings Committee II on Adnexal Findings. J Am Coll Radiol [phone]. These results will be called  to the ordering clinician or representative by the Radiologist Assistant, and communication documented in the PACS or zVision Dashboard.   Electronically Signed  By: Vinnie Langton M.D.  On: 02/06/2015 11:45          Assessment & Plan:    1. Microscopic hematuria:   Patient states that she has had microscopic hematuria for the last 2 years.  A CT urogram completed in July 2016 did not find GU pathology.  Patient is very concerned the blood still persists in her urine specimen today.  She would like the CT urogram repeated at this time.  I believe it's reasonable to do so as it has been longer than 6 months since her last CT scan and a left adnexal cyst and I could not find a follow-up pelvic ultrasound in her records.  I did not want  to ask the patient at this time if she had the area evaluated as she is quite distraught over the persistence of blood in her urine.  If the adnexal area is still present, I will inquire with the patient if she is aware of this and has it been evaluated through gynecology.  I also explained to her that she is in need of a cystoscopic examination after her CT urogram.  I have explained to the patient that they will  be scheduled for a cystoscopy in our office to evaluate their bladder.  The cystoscopy consists of passing a tube with a lens up through their urethra and into their urinary bladder.   We will inject the urethra with a lidocaine gel prior to introducing the cystoscope to help with any discomfort during the procedure.   After the procedure, they might experience blood in the urine and discomfort with urination.  This will abate after the first few voids.  I have  encouraged the patient to increase water intake  during this time.  Patient denies any allergies to lidocaine.   - Urinalysis, Complete - CULTURE, URINE COMPREHENSIVE - BUN+Creat  2. Left adnexal cyst in a post-menopausal woman:   The patient will be undergoing a CT urogram in the future  and this area will be reexamined.  Return for CT Urogram report and cystoscopy.  These notes generated with voice recognition software. I apologize for typographical errors.  Zara Council, [facility] [address], [place], [place] [address] [phone]

## 2015-10-30 LAB — BUN+CREAT
BUN / CREAT RATIO: 17 (ref 11–26)
BUN: 13 mg/dL (ref 8–27)
Creatinine, Ser: 0.77 mg/dL (ref 0.57–1.00)
GFR, EST AFRICAN AMERICAN: 96 mL/min/{1.73_m2} (ref >59)
GFR, EST NON AFRICAN AMERICAN: 84 mL/min/{1.73_m2} (ref >59)

## 2015-10-31 LAB — CULTURE, URINE COMPREHENSIVE

## 2015-11-01 DIAGNOSIS — R3129 Other microscopic hematuria: Secondary | ICD-10-CM | POA: Insufficient documentation

## 2015-11-01 DIAGNOSIS — N949 Unspecified condition associated with female genital organs and menstrual cycle: Secondary | ICD-10-CM | POA: Insufficient documentation

## 2015-11-03 ENCOUNTER — Telehealth: Payer: Self-pay

## 2015-11-03 DIAGNOSIS — N390 Urinary tract infection, site not specified: Secondary | ICD-10-CM

## 2015-11-03 NOTE — Telephone Encounter (Signed)
Spoke with pt in reference to +ucx. Made aware abx were sent to pharmacy and a urine will be needed after completing abx. Pt voiced understanding. Pt will RTC 11/18/15 for specimen recheck.

## 2015-11-03 NOTE — Telephone Encounter (Signed)
-----   Message from Nori Riis, PA-C sent at 11/02/2015 10:14 AM EDT ----- Patient has a +UCx.  They need to start Macrobid one twice daily for seven days and then recheck UA and culture three to five days after completing the antibiotic.

## 2015-11-04 ENCOUNTER — Telehealth: Payer: Self-pay

## 2015-11-04 DIAGNOSIS — N390 Urinary tract infection, site not specified: Secondary | ICD-10-CM

## 2015-11-04 MED ORDER — NITROFURANTOIN MONOHYD MACRO 100 MG PO CAPS: 100.0000 mg | ORAL_CAPSULE | Freq: Two times a day (BID) | ORAL | Status: AC

## 2015-11-04 NOTE — Telephone Encounter (Signed)
[facility]- apologizes medication was not sent in yesterday. Medication has been sent to [facility] on [address] in [place].

## 2015-11-18 ENCOUNTER — Other Ambulatory Visit: Payer: Managed Care, Other (non HMO)

## 2015-11-18 DIAGNOSIS — N390 Urinary tract infection, site not specified: Secondary | ICD-10-CM

## 2015-11-18 LAB — URINALYSIS, COMPLETE
BILIRUBIN UA: NEGATIVE
Glucose, UA: NEGATIVE
Ketones, UA: NEGATIVE
LEUKOCYTES UA: NEGATIVE
Nitrite, UA: NEGATIVE
PH UA: 6 (ref 5.0–7.5)
Protein, UA: NEGATIVE
RBC, UA: NEGATIVE
SPEC GRAV UA: 1.025 (ref 1.005–1.030)
UUROB: 0.2 mg/dL (ref 0.2–1.0)

## 2015-11-18 LAB — MICROSCOPIC EXAMINATION: BACTERIA UA: NONE SEEN

## 2015-11-21 ENCOUNTER — Ambulatory Visit
Admission: RE | Admit: 2015-11-21 | Discharge: 2015-11-21 | Disposition: A | Discharge status: Home / Self Care | Payer: Managed Care, Other (non HMO) | Source: Ambulatory Visit | Attending: Urology | Admitting: Urology

## 2015-11-21 DIAGNOSIS — R918 Other nonspecific abnormal finding of lung field: Secondary | ICD-10-CM | POA: Insufficient documentation

## 2015-11-21 DIAGNOSIS — K449 Diaphragmatic hernia without obstruction or gangrene: Secondary | ICD-10-CM | POA: Diagnosis not present

## 2015-11-21 DIAGNOSIS — R3129 Other microscopic hematuria: Secondary | ICD-10-CM | POA: Diagnosis present

## 2015-11-21 LAB — CULTURE, URINE COMPREHENSIVE

## 2015-11-21 MED ORDER — IOPAMIDOL (ISOVUE-370) INJECTION 76%
125.0000 mL | Freq: Once | INTRAVENOUS | Status: AC | PRN
  Administered 2015-11-21: 125 mL via INTRAVENOUS

## 2015-11-25 ENCOUNTER — Telehealth: Payer: Self-pay

## 2015-11-25 NOTE — Telephone Encounter (Signed)
-----   Message from Nori Riis, PA-C sent at 11/23/2015  7:07 PM EDT ----- Patient's urine culture is negative.

## 2015-11-25 NOTE — Telephone Encounter (Signed)
LMOM- recent cultures are negative.

## 2015-11-26 ENCOUNTER — Other Ambulatory Visit: Payer: Managed Care, Other (non HMO) | Admitting: Urology

## 2015-12-03 ENCOUNTER — Ambulatory Visit (INDEPENDENT_AMBULATORY_CARE_PROVIDER_SITE_OTHER): Payer: Managed Care, Other (non HMO) | Admitting: Urology

## 2015-12-03 ENCOUNTER — Encounter: Payer: Self-pay | Admitting: Urology

## 2015-12-03 VITALS — BP 143/84 | HR 76 | Ht 64.0 in | Wt 213.0 lb

## 2015-12-03 DIAGNOSIS — R3129 Other microscopic hematuria: Secondary | ICD-10-CM

## 2015-12-03 DIAGNOSIS — K219 Gastro-esophageal reflux disease without esophagitis: Secondary | ICD-10-CM | POA: Insufficient documentation

## 2015-12-03 DIAGNOSIS — N949 Unspecified condition associated with female genital organs and menstrual cycle: Secondary | ICD-10-CM

## 2015-12-03 DIAGNOSIS — R350 Frequency of micturition: Secondary | ICD-10-CM

## 2015-12-03 LAB — MICROSCOPIC EXAMINATION: Bacteria, UA: NONE SEEN

## 2015-12-03 LAB — URINALYSIS, COMPLETE
Bilirubin, UA: NEGATIVE
GLUCOSE, UA: NEGATIVE
Ketones, UA: NEGATIVE
NITRITE UA: NEGATIVE
Protein, UA: NEGATIVE
Specific Gravity, UA: 1.025 (ref 1.005–1.030)
Urobilinogen, Ur: 0.2 mg/dL (ref 0.2–1.0)
pH, UA: 5.5 (ref 5.0–7.5)

## 2015-12-03 MED ORDER — LIDOCAINE HCL 2 % EX GEL
1.0000 "application " | Freq: Once | CUTANEOUS | Status: AC
  Administered 2015-12-03: 1 via URETHRAL

## 2015-12-03 MED ORDER — CIPROFLOXACIN HCL 500 MG PO TABS
500.0000 mg | ORAL_TABLET | Freq: Once | ORAL | Status: AC
  Administered 2015-12-03: 500 mg via ORAL

## 2015-12-03 NOTE — Progress Notes (Signed)
1:47 PM  12/03/2015   Emily Burgess Apr 08, 1956 [id number]  Referring provider: Gayland Curry, MD [address] [place], [place] [address]  Chief Complaint  Patient presents with  . Cysto    HPI: 60 year old Caucasian female with a history of persistent microscopic hematuria who returns today for cystoscopy to complete her hematuria work up.   She did undergo a CT urogram in July 2016 ordered by Dr. Iona Beard that did not find any etiology for her microscopic hematuria.  She did have repeat CT urogram more recently which was negative for any GU abnormalities.  She is a nonsmoker.  She complains of daytime urinary frequency but is recently increased her water intake. She denies any nocturia. She does have very mild stress urinary incontinence with coughing but otherwise no leakage. No urgency or urge incontinence.   PMH: Past Medical History  Diagnosis Date  . Melanoma ([place])     10 years ago resected from mid chest area.  . Hypertension   . Insomnia   . Anxiety   . Hematuria     microscopic  . Ovarian cyst   . Heartburn     Surgical History: Past Surgical History  Procedure Laterality Date  . Atopic pregnancy    . Cesarean section  1980    Home Medications:    Medication List       This list is accurate as of: 12/03/15  1:47 PM.  Always use your most recent med list.               aspirin EC 81 MG tablet  Take by mouth.     atenolol 50 MG tablet  Commonly known as:  TENORMIN  TAKE 1 TABLET (50 MG TOTAL) BY MOUTH ONCE DAILY.     lisinopril-hydrochlorothiazide 10-12.5 MG tablet  Commonly known as:  PRINZIDE,ZESTORETIC  Take 1 tablet by mouth daily.        Allergies: No Known Allergies  Family History: Family History  Problem Relation Age of Onset  . Kidney disease Neg Hx   . Bladder Cancer Neg Hx   . Prostate cancer Cousin     Social History:  reports that she has never smoked. She does not have any smokeless tobacco history on  file. She reports that she does not drink alcohol or use illicit drugs.   Physical Exam: BP 143/84 mmHg  Pulse 76  Ht 5\' 4"  (1.626 m)  Wt 213 lb (96.616 kg)  BMI 36.54 kg/m2  Constitutional: Well nourished. Alert and oriented, No acute distress. HEENT: [place] AT, moist mucus membranes. Trachea midline, no masses. Cardiovascular: No clubbing, cyanosis, or edema. Respiratory: Normal respiratory effort, no increased work of breathing. GI: Obese, nontender. .   GU: No CVA tenderness.  Normal urethral meatus.  Skin: No rashes, bruises or suspicious lesions.. Neurologic: Grossly intact, no focal deficits, moving all 4 extremities. Psychiatric: Normal mood and affect.  Laboratory Data: Lab Results  Component Value Date   WBC 9.7 07/13/2012   HGB 13.2 07/13/2012   HCT 39.1 07/13/2012   MCV 85 07/13/2012   PLT 298 07/13/2012    Lab Results  Component Value Date   CREATININE 0.77 10/29/2015    Lab Results  Component Value Date   AST 29 07/13/2012   Lab Results  Component Value Date   ALT 32 07/13/2012    Urinalysis Results for orders placed or performed in visit on 12/03/15  Microscopic Examination  Result Value Ref Range  WBC, UA 11-30 (A) 0 -  5 /hpf   RBC, UA 0-2 0 -  2 /hpf   Epithelial Cells (non renal) 0-10 0 - 10 /hpf   Bacteria, UA None seen None seen/Few  Urinalysis, Complete  Result Value Ref Range   Specific Gravity, UA 1.025 1.005 - 1.030   pH, UA 5.5 5.0 - 7.5   Color, UA Yellow Yellow   Appearance Ur Clear Clear   Leukocytes, UA 1+ (A) Negative   Protein, UA Negative Negative/Trace   Glucose, UA Negative Negative   Ketones, UA Negative Negative   RBC, UA Trace (A) Negative   Bilirubin, UA Negative Negative   Urobilinogen, Ur 0.2 0.2 - 1.0 mg/dL   Nitrite, UA Negative Negative   Microscopic Examination See below:      Pertinent Imaging: Study Result     CLINICAL DATA: Micro hematuria.  EXAM: CT ABDOMEN AND PELVIS WITHOUT AND WITH  CONTRAST  TECHNIQUE: Multidetector CT imaging of the abdomen and pelvis was performed following the standard protocol before and following the bolus administration of intravenous contrast.  CONTRAST: 125 cc Isovue 370  COMPARISON: 02/06/2015  FINDINGS: Lower chest: 4 mm subpleural nodule in the lingula. This portion of the lung was not included on the previous study, nor an exam from 05/24/2005.  Hepatobiliary: No focal abnormality within the liver parenchyma. There is no evidence for gallstones, gallbladder wall thickening, or pericholecystic fluid. No intrahepatic or extrahepatic biliary dilation.  Pancreas: No focal mass lesion. No dilatation of the main duct. No intraparenchymal cyst. No peripancreatic edema.  Spleen: No splenomegaly. No focal mass lesion.  Adrenals/Urinary Tract: No adrenal nodule or mass.  Pre contrast imaging shows no stones in either kidney or ureter. No bladder stones.  Stomach/Bowel: Imaging after contrast administration shows no enhancing lesion in either kidney. 1-2 mm subcortical low-density focus in the interpolar left kidney is probably a tiny subcortical cyst. Delayed imaging shows no filling defect or wall thickening of either intrarenal collecting system or renal pelvis. Both ureters are well opacified without focal hydroureter, intraluminal filling defect, or wall thickening. No focal bladder wall abnormality is evident.  Vascular/Lymphatic: Tiny hiatal hernia noted. Stomach otherwise unremarkable. Duodenum is normally positioned as is the ligament of [place]itz. No small bowel wall thickening. No small bowel dilatation. The terminal ileum is normal. The appendix is normal. No gross colonic mass. No colonic wall thickening. No substantial diverticular change.  Reproductive: The uterus has normal CT imaging appearance. 3.2 cm simple cyst in the left ovary is compatible with a small physiologic cyst or dominant follicle. No  right adnexal mass.  Other: No intraperitoneal free fluid.  Musculoskeletal: Bone windows reveal no worrisome lytic or sclerotic osseous lesions.  IMPRESSION: 1. No CT findings to explain the patient's history of micro hematuria. 2. 4 mm subpleural nodule in the lingula. No follow-up needed if patient is low-risk. Non-contrast chest CT can be considered in 12 months if patient is high-risk. This recommendation follows the consensus statement: Guidelines for Management of Incidental Pulmonary Nodules Detected on CT Images:From the Fleischner Society 2017; published online before print (10.1148/radiol.SG:5268862). 3. Tiny hiatal hernia.   Electronically Signed  By: Misty Stanley M.D.  On: 11/21/2015 16:43     Cystoscopy Procedure Note  Patient identification was confirmed, informed consent was obtained, and patient was prepped using Betadine solution.  Lidocaine jelly was administered per urethral meatus.    Preoperative abx where received prior to procedure.    Procedure: - Flexible cystoscope introduced,  without any difficulty.   - Thorough search of the bladder revealed:    normal urethral meatus    normal urothelium    no stones    no ulcers     no tumors    no urethral polyps    no trabeculation  - Ureteral orifices were normal in position and appearance.  Post-Procedure: - Patient tolerated the procedure well   Assessment & Plan:    1. Microscopic hematuria:     Workup including CT urogram, cystoscopy negative. Findings reviewed with the patient. Nonsmoker, relatively low risk Plan follow up with PCP, repeat UA in one year. Return to care in 2-3 years if hematuria persists - Urinalysis, Complete  2. Left adnexal cyst in a post-menopausal woman:   Decrased in size over past year, advised to f/u with Ob/GYN Simple left cyst  3. Urinary frequency without nocturia Behavioral modification discussed with the patient.  F/u PRN  Hollice Espy,  MD  [facility] [address] [place], [place] [address] [phone]

## 2015-12-09 ENCOUNTER — Encounter: Payer: Self-pay | Admitting: Emergency Medicine

## 2015-12-09 ENCOUNTER — Ambulatory Visit
Admission: EM | Admit: 2015-12-09 | Discharge: 2015-12-09 | Disposition: A | Discharge status: Home / Self Care | Payer: Managed Care, Other (non HMO) | Attending: Family Medicine | Admitting: Family Medicine

## 2015-12-09 DIAGNOSIS — J209 Acute bronchitis, unspecified: Secondary | ICD-10-CM

## 2015-12-09 MED ORDER — IPRATROPIUM-ALBUTEROL 0.5-2.5 (3) MG/3ML IN SOLN
3.0000 mL | Freq: Once | RESPIRATORY_TRACT | Status: AC
  Administered 2015-12-09: 3 mL via RESPIRATORY_TRACT

## 2015-12-09 MED ORDER — HYDROCOD POLST-CPM POLST ER 10-8 MG/5ML PO SUER: 5.0000 mL | Freq: Two times a day (BID) | ORAL | Status: DC | PRN

## 2015-12-09 MED ORDER — PREDNISONE 10 MG (21) PO TBPK
ORAL_TABLET | ORAL | Status: DC
Start: 2015-12-09 — End: 2016-11-21

## 2015-12-09 MED ORDER — AZITHROMYCIN 250 MG PO TABS: ORAL_TABLET | ORAL | Status: DC

## 2015-12-09 MED ORDER — ALBUTEROL SULFATE HFA 108 (90 BASE) MCG/ACT IN AERS: 2.0000 | INHALATION_SPRAY | Freq: Four times a day (QID) | RESPIRATORY_TRACT | Status: DC | PRN

## 2015-12-09 NOTE — Discharge Instructions (Signed)

## 2015-12-09 NOTE — ED Provider Notes (Signed)
CSN: [id number]     Arrival date & time 12/09/15  1755 History   First MD Initiated Contact with Patient 12/09/15 1823    Nurses notes were reviewed.  Chief Complaint  Patient presents with  . Cough  . Shortness of Breath   Patient reports shortness of breath and coughing for about 2 weeks. She states the cough is nonproductive but is progressively gotten worse is keeping her up at night and she is experiencing wheezing. She denies any history of asthma or bronchitis before this started happening. She does not smoke. She does have history hypertension anxiety hematuria ovarian cyst. Family medical history no pertinent family medical history she does history cancer both bladder and prostate cancer in some family members. She never smoked and she does not have any drug allergies.   (Consider location/radiation/quality/duration/timing/severity/associated sxs/prior Treatment) Patient is a 60 y.o. female presenting with cough and shortness of breath. The history is provided by the patient. No language interpreter was used.  Cough Cough characteristics:  Non-productive and hoarse Severity:  Moderate Onset quality:  Sudden Timing:  Constant Progression:  Worsening Chronicity:  New Context: upper respiratory infection   Context: not sick contacts   Relieved by:  Nothing Ineffective treatments:  Cough suppressants Associated symptoms: rhinorrhea, shortness of breath, sinus congestion and wheezing   Associated symptoms: no chest pain, no ear pain and no rash   Shortness of Breath Associated symptoms: cough and wheezing   Associated symptoms: no chest pain, no ear pain and no rash     Past Medical History  Diagnosis Date  . Melanoma ([place])     10 years ago resected from mid chest area.  . Hypertension   . Insomnia   . Anxiety   . Hematuria     microscopic  . Ovarian cyst   . Heartburn    Past Surgical History  Procedure Laterality Date  . Atopic pregnancy    . Cesarean section  1980    Family History  Problem Relation Age of Onset  . Kidney disease Neg Hx   . Bladder Cancer Neg Hx   . Prostate cancer Cousin    Social History  Substance Use Topics  . Smoking status: Never Smoker   . Smokeless tobacco: None  . Alcohol Use: No   OB History    No data available     Review of Systems  HENT: Positive for congestion and rhinorrhea. Negative for ear pain.   Eyes: Negative.   Respiratory: Positive for cough, shortness of breath and wheezing.   Cardiovascular: Negative for chest pain.  Skin: Negative for rash.  All other systems reviewed and are negative.   Allergies  Review of patient's allergies indicates no known allergies.  Home Medications   Prior to Admission medications   Medication Sig Start Date End Date Taking? Authorizing Provider  albuterol (PROVENTIL HFA;VENTOLIN HFA) 108 (90 Base) MCG/ACT inhaler Inhale 2 puffs into the lungs every 6 (six) hours as needed for wheezing or shortness of breath. 12/09/15   Frederich Cha, MD  aspirin EC 81 MG tablet Take by mouth.    Historical Provider, MD  atenolol (TENORMIN) 50 MG tablet TAKE 1 TABLET (50 MG TOTAL) BY MOUTH ONCE DAILY. 07/06/15   Historical Provider, MD  azithromycin (ZITHROMAX Z-PAK) 250 MG tablet Take 2 tablets first day and then 1 po a day for 4 days 12/09/15   Frederich Cha, MD  chlorpheniramine-HYDROcodone [facility] PENNKINETIC ER) 10-8 MG/5ML SUER Take 5 mLs by mouth every 12 (  twelve) hours as needed for cough. 12/09/15   Frederich Cha, MD  lisinopril-hydrochlorothiazide (PRINZIDE,ZESTORETIC) 10-12.5 MG tablet Take 1 tablet by mouth daily. 07/06/15   Historical Provider, MD  predniSONE (STERAPRED UNI-PAK 21 TAB) 10 MG (21) TBPK tablet Sig 6 tablet day 1, 5 tablets day 2, 4 tablets day 3,,3tablets day 4, 2 tablets day 5, 1 tablet day 6 take all tablets orally 12/09/15   Frederich Cha, MD   Meds Ordered and Administered this Visit   Medications  ipratropium-albuterol (DUONEB) 0.5-2.5 (3) MG/3ML nebulizer  solution 3 mL (3 mLs Nebulization Given 12/09/15 1819)    BP 138/65 mmHg  Pulse 109  Temp(Src) 98.1 F (36.7 C) (Oral)  Resp 18  Ht 5\' 4"  (1.626 m)  Wt 210 lb (95.255 kg)  BMI 36.03 kg/m2  SpO2 98% No data found.   Physical Exam  Constitutional: She is oriented to person, place, and time. She appears well-developed and well-nourished.  HENT:  Head: Normocephalic and atraumatic.  Right Ear: External ear normal.  Left Ear: External ear normal.  Mouth/Throat: Oropharynx is clear and moist.  Eyes: Conjunctivae are normal. Pupils are equal, round, and reactive to light.  Neck: Normal range of motion. Neck supple.  Cardiovascular: Normal rate and regular rhythm.   Pulmonary/Chest: She has wheezes.  Musculoskeletal: Normal range of motion. She exhibits no tenderness.  Lymphadenopathy:    She has no cervical adenopathy.  Neurological: She is alert and oriented to person, place, and time.  Skin: Skin is warm.  Psychiatric: She has a normal mood and affect. Her behavior is normal.  Vitals reviewed.   ED Course  Procedures (including critical care time)  Labs Review Labs Reviewed - No data to display  Imaging Review No results found.   Visual Acuity Review  Right Eye Distance:   Left Eye Distance:   Bilateral Distance:    Right Eye Near:   Left Eye Near:    Bilateral Near:         MDM   1. Acute bronchitis with bronchospasm    Patient after having the DuoNeb treatment mood air better pulse ox went down from 98-95. She was coughing even more once the DuoNeb was given to take secondary to the belief bronchospasm. We will place on 6 day course of prednisone albuterol inhaler Zithromax and and Tussionex 1 teaspoon twice a day. Will give a note for work for today and tomorrow follow-up in 1-2 weeks with PCP if not better.   Note: This dictation was prepared with Dragon dictation along with smaller phrase technology. Any transcriptional errors that result from this  process are unintentional.    Frederich Cha, MD 12/09/15 1901

## 2015-12-09 NOTE — ED Notes (Signed)
Pt reports having cough for 2 weeks and SOB started about a week ago. Subjective fever.

## 2016-05-31 ENCOUNTER — Other Ambulatory Visit: Payer: Self-pay | Admitting: Family Medicine

## 2016-05-31 DIAGNOSIS — Z1231 Encounter for screening mammogram for malignant neoplasm of breast: Secondary | ICD-10-CM

## 2016-06-03 ENCOUNTER — Ambulatory Visit
Admission: RE | Admit: 2016-06-03 | Discharge: 2016-06-03 | Disposition: A | Discharge status: Home / Self Care | Payer: Managed Care, Other (non HMO) | Source: Ambulatory Visit | Attending: Family Medicine | Admitting: Family Medicine

## 2016-06-03 ENCOUNTER — Encounter (INDEPENDENT_AMBULATORY_CARE_PROVIDER_SITE_OTHER): Payer: Self-pay

## 2016-06-03 DIAGNOSIS — Z1231 Encounter for screening mammogram for malignant neoplasm of breast: Secondary | ICD-10-CM | POA: Insufficient documentation

## 2016-06-22 ENCOUNTER — Ambulatory Visit
Admission: EM | Admit: 2016-06-22 | Discharge: 2016-06-22 | Disposition: A | Discharge status: Home / Self Care | Payer: Managed Care, Other (non HMO) | Attending: Family Medicine | Admitting: Family Medicine

## 2016-06-22 ENCOUNTER — Encounter: Payer: Self-pay | Admitting: *Deleted

## 2016-06-22 DIAGNOSIS — J111 Influenza due to unidentified influenza virus with other respiratory manifestations: Secondary | ICD-10-CM

## 2016-06-22 DIAGNOSIS — R69 Illness, unspecified: Secondary | ICD-10-CM

## 2016-06-22 DIAGNOSIS — R6889 Other general symptoms and signs: Secondary | ICD-10-CM

## 2016-06-22 LAB — RAPID INFLUENZA A&B ANTIGENS (ARMC ONLY)
INFLUENZA A (ARMC): NEGATIVE
INFLUENZA B (ARMC): NEGATIVE

## 2016-06-22 LAB — RAPID STREP SCREEN (MED CTR MEBANE ONLY): Streptococcus, Group A Screen (Direct): NEGATIVE

## 2016-06-22 MED ORDER — OSELTAMIVIR PHOSPHATE 75 MG PO CAPS: 75.0000 mg | ORAL_CAPSULE | Freq: Two times a day (BID) | ORAL | 0 refills | Status: DC

## 2016-06-22 MED ORDER — FEXOFENADINE-PSEUDOEPHED ER 180-240 MG PO TB24: 1.0000 | ORAL_TABLET | Freq: Every day | ORAL | 0 refills | Status: DC

## 2016-06-22 MED ORDER — MELOXICAM 15 MG PO TABS: 15.0000 mg | ORAL_TABLET | Freq: Every day | ORAL | 0 refills | Status: DC

## 2016-06-22 MED ORDER — BENZONATATE 200 MG PO CAPS: 200.0000 mg | ORAL_CAPSULE | Freq: Three times a day (TID) | ORAL | 0 refills | Status: DC | PRN

## 2016-06-22 NOTE — ED Provider Notes (Signed)
[facility]    CSN: [id number] Arrival date & time: 06/22/16  1556     History   Chief Complaint Chief Complaint  Patient presents with  . Sore Throat  . Fever  . Generalized Body Aches  . Nasal Congestion    HPI Emily Burgess is a 60 y.o. female.   Patient is a 59 year old white female with symptoms of nasal congestion coughing and general malaise. She feels achy all over and some running a fever as well at home. She's not had a flu shot yet. 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 Hypertension ovarian cyst. Surgery she's recently had neck surgery she's had ectopic pregnancy and C-section. She does not smoke. No significant family medical history pertinent to today's visit no known drug allergies.   The history is provided by the patient. No language interpreter was used.  Sore Throat  This is a new problem. The current episode started yesterday. The problem occurs constantly. The problem has been gradually worsening. Pertinent negatives include no chest pain, no abdominal pain, no headaches and no shortness of breath. Nothing aggravates the symptoms. Nothing relieves the symptoms. The treatment provided no relief.  Fever  Temp source:  Subjective and oral Timing:  Constant Progression:  Worsening Chronicity:  New Relieved by:  Nothing Worsened by:  Nothing Associated symptoms: chills, congestion, myalgias, rhinorrhea and sore throat   Associated symptoms: no chest pain and no headaches     Past Medical History:  Diagnosis Date  . Anxiety   . Heartburn   . Hematuria    microscopic  . Hypertension   . Insomnia   . Melanoma ([place])    10 years ago resected from mid chest area.  . Ovarian cyst     Patient Active Problem List   Diagnosis Date Noted  . Acid reflux 12/03/2015  . Microscopic hematuria 11/01/2015  . Adnexal cyst 11/01/2015  . Cyst of ovary 10/01/2015  .  Avitaminosis D 12/12/2013  . BP (high blood pressure) 05/31/2013  . Rotator cuff syndrome 09/20/2012    Past Surgical History:  Procedure Laterality Date  . atopic pregnancy    . CESAREAN SECTION  1980    OB History    No data available       Home Medications    Prior to Admission medications   Medication Sig Start Date End Date Taking? Authorizing Provider  aspirin EC 81 MG tablet Take by mouth.   Yes Historical Provider, MD  atenolol (TENORMIN) 50 MG tablet TAKE 1 TABLET (50 MG TOTAL) BY MOUTH ONCE DAILY. 07/06/15  Yes Historical Provider, MD  lisinopril-hydrochlorothiazide (PRINZIDE,ZESTORETIC) 10-12.5 MG tablet Take 1 tablet by mouth daily. 07/06/15  Yes Historical Provider, MD  albuterol (PROVENTIL HFA;VENTOLIN HFA) 108 (90 Base) MCG/ACT inhaler Inhale 2 puffs into the lungs every 6 (six) hours as needed for wheezing or shortness of breath. 12/09/15   Frederich Cha, MD  azithromycin (ZITHROMAX Z-PAK) 250 MG tablet Take 2 tablets first day and then 1 po a day for 4 days 12/09/15   Frederich Cha, MD  benzonatate (TESSALON) 200 MG capsule Take 1 capsule (200 mg total) by mouth 3 (three) times daily as needed. 06/22/16   Frederich Cha, MD  chlorpheniramine-HYDROcodone [facility] PENNKINETIC ER) 10-8 MG/5ML SUER Take 5 mLs by mouth every 12 (twelve) hours as needed for cough. 12/09/15   Frederich Cha, MD  fexofenadine-pseudoephedrine [facility] ALLERGY & CONGESTION) 180-240 MG  24 hr tablet Take 1 tablet by mouth daily. 06/22/16   Frederich Cha, MD  meloxicam (MOBIC) 15 MG tablet Take 1 tablet (15 mg total) by mouth daily. 06/22/16   Frederich Cha, MD  oseltamivir (TAMIFLU) 75 MG capsule Take 1 capsule (75 mg total) by mouth 2 (two) times daily. 06/22/16   Frederich Cha, MD  predniSONE (STERAPRED UNI-PAK 21 TAB) 10 MG (21) TBPK tablet Sig 6 tablet day 1, 5 tablets day 2, 4 tablets day 3,,3tablets day 4, 2 tablets day 5, 1 tablet day 6 take all tablets orally 12/09/15   Frederich Cha, MD    Family  History Family History  Problem Relation Age of Onset  . Prostate cancer Cousin   . Kidney disease Neg Hx   . Bladder Cancer Neg Hx   . Breast cancer Neg Hx     Social History Social History  Substance Use Topics  . Smoking status: Never Smoker  . Smokeless tobacco: Never Used  . Alcohol use No     Allergies   Patient has no known allergies.   Review of Systems Review of Systems  Constitutional: Positive for chills and fever.  HENT: Positive for congestion, rhinorrhea and sore throat.   Respiratory: Negative for shortness of breath.   Cardiovascular: Negative for chest pain.  Gastrointestinal: Negative for abdominal pain.  Musculoskeletal: Positive for myalgias.  Neurological: Negative for headaches.  All other systems reviewed and are negative.    Physical Exam Triage Vital Signs ED Triage Vitals  Enc Vitals Group     BP 06/22/16 1716 124/69     Pulse Rate 06/22/16 1716 66     Resp 06/22/16 1716 16     Temp 06/22/16 1716 98.5 F (36.9 C)     Temp Source 06/22/16 1716 Oral     SpO2 06/22/16 1716 96 %     Weight 06/22/16 1722 230 lb (104.3 kg)     Height 06/22/16 1722 5\' 4"  (1.626 m)     Head Circumference --      Peak Flow --      Pain Score --      Pain Loc --      Pain Edu? --      Excl. in [place]? --    No data found.   Updated Vital Signs BP 124/69 (BP Location: Left Arm)   Pulse 66   Temp 98.5 F (36.9 C) (Oral)   Resp 16   Ht 5\' 4"  (1.626 m)   Wt 230 lb (104.3 kg)   SpO2 96%   BMI 39.48 kg/m   Visual Acuity Right Eye Distance:   Left Eye Distance:   Bilateral Distance:    Right Eye Near:   Left Eye Near:    Bilateral Near:     Physical Exam  Constitutional: She is oriented to person, place, and time. She appears well-developed. She is active.  Non-toxic appearance. She has a sickly appearance.  HENT:  Head: Normocephalic.  Right Ear: Hearing, tympanic membrane, external ear and ear canal normal.  Left Ear: Hearing, tympanic  membrane, external ear and ear canal normal.  Eyes: Pupils are equal, round, and reactive to light.  Neck: Normal range of motion. Neck supple.  Cardiovascular: Normal rate and regular rhythm.   Pulmonary/Chest: Effort normal and breath sounds normal.  Lymphadenopathy:    She has cervical adenopathy.  Neurological: She is alert and oriented to person, place, and time.  Skin: Skin is warm.  Psychiatric: She has a  normal mood and affect.  Vitals reviewed.    UC Treatments / Results  Labs (all labs ordered are listed, but only abnormal results are displayed) Labs Reviewed  RAPID STREP SCREEN (NOT AT [facility])  RAPID INFLUENZA A&B ANTIGENS ([facility] ONLY)  CULTURE, GROUP A STREP [facility])    EKG  EKG Interpretation None       Radiology No results found.  Procedures Procedures (including critical care time)  Medications Ordered in UC Medications - No data to display   Initial Impression / Assessment and Plan / UC Course  I have reviewed the triage vital signs and the nursing notes.  Pertinent labs & imaging results that were available during my care of the patient were reviewed by me and considered in my medical decision making (see chart for details).    Results for orders placed or performed during the hospital encounter of 06/22/16  Rapid strep screen  Result Value Ref Range   Streptococcus, Group A Screen (Direct) NEGATIVE NEGATIVE  Rapid Influenza A&B Antigens ([facility] only)  Result Value Ref Range   Influenza A ([facility]) NEGATIVE NEGATIVE   Influenza B ([facility]) NEGATIVE NEGATIVE   Clinical Course     Patient has flulike presentation. No flu test strep test was negative will treat for the flu Tamiflu 75 mg 1 capsule twice a day and Tessalon Perles cough gets worse. Allegra-D and Mobic 15 mg 1 tablet day. Follow-up PCP if not better in a week. She declined work note since she's on disability now due to recent neck surgery.  Final Clinical Impressions(s) / UC Diagnoses    Final diagnoses:  Influenza-like illness  Flu-like symptoms    New Prescriptions Discharge Medication List as of 06/22/2016  6:26 PM    START taking these medications   Details  benzonatate (TESSALON) 200 MG capsule Take 1 capsule (200 mg total) by mouth 3 (three) times daily as needed., Starting Tue 06/22/2016, Normal    fexofenadine-pseudoephedrine (ALLEGRA-D ALLERGY & CONGESTION) 180-240 MG 24 hr tablet Take 1 tablet by mouth daily., Starting Tue 06/22/2016, Normal    meloxicam (MOBIC) 15 MG tablet Take 1 tablet (15 mg total) by mouth daily., Starting Tue 06/22/2016, Normal    oseltamivir (TAMIFLU) 75 MG capsule Take 1 capsule (75 mg total) by mouth 2 (two) times daily., Starting Tue 06/22/2016, Normal         Frederich Cha, MD 06/22/16 1905

## 2016-06-22 NOTE — ED Triage Notes (Signed)
Sore throat, body aches, fever, and runny nose x2 days.

## 2016-06-25 LAB — CULTURE, GROUP A STREP (THRC)

## 2016-06-26 ENCOUNTER — Telehealth: Payer: Self-pay | Admitting: *Deleted

## 2016-06-26 NOTE — Telephone Encounter (Signed)
Called patient, no answer, communicated negative strep culture results. Advised patient to follow up with PCP or [facility] if symptoms persist.

## 2016-11-21 ENCOUNTER — Encounter: Payer: Self-pay | Admitting: Emergency Medicine

## 2016-11-21 ENCOUNTER — Ambulatory Visit
Admission: EM | Admit: 2016-11-21 | Discharge: 2016-11-21 | Disposition: A | Discharge status: Home / Self Care | Payer: Managed Care, Other (non HMO) | Attending: Family Medicine | Admitting: Family Medicine

## 2016-11-21 DIAGNOSIS — M545 Low back pain, unspecified: Secondary | ICD-10-CM

## 2016-11-21 MED ORDER — PREDNISONE 10 MG PO TABS: ORAL_TABLET | ORAL | 0 refills | Status: DC

## 2016-11-21 MED ORDER — KETOROLAC TROMETHAMINE 60 MG/2ML IM SOLN
60.0000 mg | Freq: Once | INTRAMUSCULAR | Status: AC
  Administered 2016-11-21: 60 mg via INTRAMUSCULAR

## 2016-11-21 MED ORDER — TRAMADOL HCL 50 MG PO TABS: 50.0000 mg | ORAL_TABLET | Freq: Three times a day (TID) | ORAL | 0 refills | Status: DC | PRN

## 2016-11-21 NOTE — Discharge Instructions (Signed)
Take medication as prescribed. Rest. Drink plenty of fluids.   Follow up with your orthopedic and pain management as discussed.   Follow up with your primary care physician this week as needed. Return to Urgent care for new or worsening concerns.

## 2016-11-21 NOTE — ED Provider Notes (Signed)
[facility] ____________________________________________  Time seen: Approximately 3:37 PM  I have reviewed the triage vital signs and the nursing notes.   HISTORY  Chief Complaint Back Pain   HPI Emily Burgess is a 61 y.o. female presenting with family at bedside for evaluation of low back pain that has been bothering her for the last several weeks. Patient reports her pain is been bothering her more so over the last 2 weeks. Patient reports that she has a history of chronic low back pain including degenerative disc disease in which she previously had had a spinal injection for pain control that worked well for her. Patient reports her spinal injection with approximate 2.5 years ago and her low back pain has been mostly controlled since then. Reports other pain issues recently was last year she had cervical surgery which helped with her cervical chronic pain. Patient states between previous injection and cervical surgery, her pain had been much better and no longer on chronic pain medications.   Patient reports her current pain is very consistent with her previous low back pain. Patient reports that she does occasionally have some sciatic radiation, denies currently. Patient reports her pain is worse with twisting movements. Reports she has chronic left outer thigh numbness without any change. Denies any fall, injury, or known trigger. Reports this is been gradual in onset. Reports over-the-counter ibuprofen helps some but no resolution. Has not been taking any other medications at home for the same complaints. Denies abdominal pain, dysuria, paresthesias, urinary or bowel retention or incontinence or other complaints.Denies chest pain, shortness of breath, abdominal pain, dysuria, extremity pain, extremity swelling or rash. Denies recent sickness. Denies recent antibiotic use. Denies renal insufficiency.   Gayland Curry, MD PCP   Past Medical History:  Diagnosis Date  .  Anxiety   . Heartburn   . Hematuria    microscopic  . Hypertension   . Insomnia   . Melanoma ([place])    10 years ago resected from mid chest area.  . Ovarian cyst     Patient Active Problem List   Diagnosis Date Noted  . Acid reflux 12/03/2015  . Microscopic hematuria 11/01/2015  . Adnexal cyst 11/01/2015  . Cyst of ovary 10/01/2015  . Avitaminosis D 12/12/2013  . BP (high blood pressure) 05/31/2013  . Rotator cuff syndrome 09/20/2012    Past Surgical History:  Procedure Laterality Date  . atopic pregnancy    . BACK SURGERY    . CESAREAN SECTION  1980     No current facility-administered medications for this encounter.   Current Outpatient Prescriptions:  .  albuterol (PROVENTIL HFA;VENTOLIN HFA) 108 (90 Base) MCG/ACT inhaler, Inhale 2 puffs into the lungs every 6 (six) hours as needed for wheezing or shortness of breath., Disp: 1 Inhaler, Rfl: 0 .  aspirin EC 81 MG tablet, Take by mouth., Disp: , Rfl:  .  atenolol (TENORMIN) 50 MG tablet, TAKE 1 TABLET (50 MG TOTAL) BY MOUTH ONCE DAILY., Disp: , Rfl: 2 .  lisinopril-hydrochlorothiazide (PRINZIDE,ZESTORETIC) 10-12.5 MG tablet, Take 1 tablet by mouth daily., Disp: , Rfl: 2 .  predniSONE (DELTASONE) 10 MG tablet, Start 60 mg po day one, then 50 mg po day two, taper by 10 mg daily until complete., Disp: 21 tablet, Rfl: 0 .  traMADol (ULTRAM) 50 MG tablet, Take 1 tablet (50 mg total) by mouth every 8 (eight) hours as needed for moderate pain or severe pain (Do not drive or operate machinery while taking as can cause  drowsiness.)., Disp: 10 tablet, Rfl: 0  Allergies Patient has no known allergies.  Family History  Problem Relation Age of Onset  . Prostate cancer Cousin   . Kidney disease Neg Hx   . Bladder Cancer Neg Hx   . Breast cancer Neg Hx     Social History Social History  Substance Use Topics  . Smoking status: Never Smoker  . Smokeless tobacco: Never Used  . Alcohol use No    Review of  Systems Constitutional: No fever/chills ENT: No sore throat. Cardiovascular: Denies chest pain. Respiratory: Denies shortness of breath. Gastrointestinal: No abdominal pain.  No nausea, no vomiting.  No diarrhea.  No constipation. Genitourinary: Negative for dysuria. Musculoskeletal: Positive for back pain. Skin: Negative for rash.   ____________________________________________   PHYSICAL EXAM:  VITAL SIGNS: ED Triage Vitals  Enc Vitals Group     BP 11/21/16 1158 134/84     Pulse Rate 11/21/16 1158 65     Resp 11/21/16 1158 16     Temp 11/21/16 1158 97.9 F (36.6 C)     Temp Source 11/21/16 1158 Oral     SpO2 11/21/16 1158 97 %     Weight 11/21/16 1155 220 lb (99.8 kg)     Height 11/21/16 1155 5\' 4"  (1.626 m)     Head Circumference --      Peak Flow --      Pain Score 11/21/16 1155 9     Pain Loc --      Pain Edu? --      Excl. in [place]? --     Constitutional: Alert and oriented. Well appearing and in no acute distress. Eyes: Conjunctivae are normal.  Cardiovascular: Normal rate, regular rhythm. Grossly normal heart sounds.  Good peripheral circulation. Respiratory: Normal respiratory effort without tachypnea nor retractions. Breath sounds are clear and equal bilaterally. No wheezes, rales, rhonchi. Gastrointestinal: Soft and nontender. No CVA tenderness. Musculoskeletal:  No midline cervical or thoracic tenderness to palpation. Bilateral pedal pulses equal and easily palpated.      Right lower leg:  No tenderness or edema.      Left lower leg:  No tenderness or edema.  Mild midline lower lumbar tenderness to palpation and mild left sciatic notch tenderness to palpation, no ecchymosis, no erythema, pain increases with right lumbar rotation, no pain with left rotation or lumbar flexion or extension, full range of motion present, no saddle anesthesia, steady gait, changes positions quickly from sitting to standing and ambulating. Bilateral plantar flexion and dorsiflexion  strong and equal. Neurologic:  Normal speech and language. Speech is normal. No gait instability.  Skin:  Skin is warm, dry and intact. No rash noted. Psychiatric: Mood and affect are normal. Speech and behavior are normal. Patient exhibits appropriate insight and judgment   ___________________________________________   LABS (all labs ordered are listed, but only abnormal results are displayed)  Labs Reviewed - No data to display  RADIOLOGY  No results found. ____________________________________________   PROCEDURES Procedures    INITIAL IMPRESSION / ASSESSMENT AND PLAN / ED COURSE  Pertinent labs & imaging results that were available during my care of the patient were reviewed by me and considered in my medical decision making (see chart for details).  Mother and patient. No acute distress. Family bedside. Patient with acute on chronic low back pain. Discussed in detail with patient and family treatment options. 60 mg IM Toradol given once in urgent care. Will treat patient with oral prednisone taper and when necessary  tramadol. Encouraged rest, fluids, supportive care. Avoid strenuous activity and stretching. Follow-up with pain management orthopedist. [place] controlled substance database reviewed for last one year, most recent controlled medication was 03/26/16 #120 5mg  oxycodone then 02/20/16 tramadol #100.   Discussed follow up with Primary care physician this week. Discussed follow up and return parameters including no resolution or any worsening concerns. Patient verbalized understanding and agreed to plan.   ____________________________________________   FINAL CLINICAL IMPRESSION(S) / ED DIAGNOSES  Final diagnoses:  Midline low back pain without sciatica, unspecified chronicity     Discharge Medication List as of 11/21/2016  2:05 PM    START taking these medications   Details  predniSONE (DELTASONE) 10 MG tablet Start 60 mg po day one, then 50 mg po day two,  taper by 10 mg daily until complete., Normal    traMADol (ULTRAM) 50 MG tablet Take 1 tablet (50 mg total) by mouth every 8 (eight) hours as needed for moderate pain or severe pain (Do not drive or operate machinery while taking as can cause drowsiness.)., Starting Sun 11/21/2016, Print        Note: This dictation was prepared with Dragon dictation along with smaller phrase technology. Any transcriptional errors that result from this process are unintentional.         Marylene Land, NP 11/21/16 [place], NP 11/21/16 1550

## 2016-11-21 NOTE — ED Triage Notes (Signed)
Patient c/o chronic lower back pain that has gotten worse over the past 2 weeks.

## 2016-11-21 NOTE — ED Notes (Signed)
Patient shows no signs of adverse reaction to medication at this time.  

## 2017-02-08 ENCOUNTER — Encounter: Payer: Self-pay | Admitting: Obstetrics and Gynecology

## 2017-02-08 ENCOUNTER — Ambulatory Visit (INDEPENDENT_AMBULATORY_CARE_PROVIDER_SITE_OTHER): Payer: Managed Care, Other (non HMO) | Admitting: Obstetrics and Gynecology

## 2017-02-08 VITALS — BP 120/80 | HR 61 | Ht 64.0 in | Wt 219.0 lb

## 2017-02-08 DIAGNOSIS — Z01419 Encounter for gynecological examination (general) (routine) without abnormal findings: Secondary | ICD-10-CM

## 2017-02-08 DIAGNOSIS — Z1211 Encounter for screening for malignant neoplasm of colon: Secondary | ICD-10-CM | POA: Diagnosis not present

## 2017-02-08 DIAGNOSIS — Z124 Encounter for screening for malignant neoplasm of cervix: Secondary | ICD-10-CM

## 2017-02-08 DIAGNOSIS — N83202 Unspecified ovarian cyst, left side: Secondary | ICD-10-CM | POA: Diagnosis not present

## 2017-02-08 DIAGNOSIS — Z1239 Encounter for other screening for malignant neoplasm of breast: Secondary | ICD-10-CM

## 2017-02-08 DIAGNOSIS — Z1151 Encounter for screening for human papillomavirus (HPV): Secondary | ICD-10-CM

## 2017-02-08 DIAGNOSIS — Z1231 Encounter for screening mammogram for malignant neoplasm of breast: Secondary | ICD-10-CM

## 2017-02-08 NOTE — Progress Notes (Signed)
Chief Complaint  Patient presents with  . Gynecologic Exam    HPI:      Ms. Emily Burgess is a 61 y.o. G2P1011 who LMP was No LMP recorded. Patient is postmenopausal., presents today for her annual examination.  Her menses are absent due to menopause. She does not have intermenstrual bleeding.  She does not have vasomotor sx. Sex activity: not sexually active. She does not have vaginal dryness.  Last Pap: May 30, 2014  Results were: no abnormalities /neg HPV DNA.  Hx of STDs: none  Last mammogram: July 01, 2016  Results were: normal--routine follow-up in 12 months There is no FH of breast cancer. There is no FH of ovarian cancer. The patient does do self-breast exams.  Colonoscopy: several yrs ago; pt thinks she is due again; saw Dr. Vira Agar last time   Tobacco use: The patient denies current or previous tobacco use. Alcohol use: none Exercise: not active  She does not get adequate calcium and Vitamin D in her diet. She has labs with PCP.  Past Medical History:  Diagnosis Date  . Anxiety   . Degenerative disc disease, lumbar   . Heartburn   . Hematuria    microscopic  . History of mammogram   . History of Papanicolaou smear of cervix 05/30/2014   -/-  . Hypertension   . Insomnia   . Lumbago   . Melanoma ([place])    10 years ago resected from mid chest area.  . Neck pain 06/2015   hurt hand in elevator at work which pt now has tremendous neck pain.  Nerve block done in lower back.  Dr. Arvella Nigh  . Ovarian cyst 2007; 2012   left ovarian mass  . Vitamin D deficiency     Past Surgical History:  Procedure Laterality Date  . BACK SURGERY     nerve block for lower back pain 2015.  2017 waiting for nerve block for neck pain  . CESAREAN SECTION  1980  . ECTOPIC PREGNANCY SURGERY  1982-83  . SALPINGECTOMY  1982   ectopic     Family History  Problem Relation Age of Onset  . Prostate cancer Cousin   . Hypertension Mother   . Diabetes Father        type  2  . Cancer Maternal Grandfather        bone   . Diabetes Paternal Grandmother        type 2  . Kidney disease Neg Hx   . Bladder Cancer Neg Hx   . Breast cancer Neg Hx     Social History   Social History  . Marital status: Single    Spouse name: N/A  . Number of children: 1  . Years of education: 37   Occupational History  . police officer    Social History Main Topics  . Smoking status: Never Smoker  . Smokeless tobacco: Never Used  . Alcohol use No  . Drug use: No  . Sexual activity: Not Currently    Birth control/ protection: Post-menopausal   Other Topics Concern  . Not on file   Social History Narrative  . No narrative on file     Current Outpatient Prescriptions:  .  albuterol (PROVENTIL HFA;VENTOLIN HFA) 108 (90 Base) MCG/ACT inhaler, Inhale 2 puffs into the lungs every 6 (six) hours as needed for wheezing or shortness of breath., Disp: 1 Inhaler, Rfl: 0 .  aspirin EC 81 MG tablet, Take by mouth., Disp: ,  Rfl:  .  atenolol (TENORMIN) 50 MG tablet, TAKE 1 TABLET (50 MG TOTAL) BY MOUTH ONCE DAILY., Disp: , Rfl: 2 .  ibuprofen (ADVIL,MOTRIN) 200 MG tablet, Take 800 mg by mouth daily as needed., Disp: , Rfl:  .  lisinopril-hydrochlorothiazide (PRINZIDE,ZESTORETIC) 10-12.5 MG tablet, Take 1 tablet by mouth daily., Disp: , Rfl: 2 .  predniSONE (DELTASONE) 10 MG tablet, Start 60 mg po day one, then 50 mg po day two, taper by 10 mg daily until complete., Disp: 21 tablet, Rfl: 0 .  traMADol (ULTRAM) 50 MG tablet, Take 1 tablet (50 mg total) by mouth every 8 (eight) hours as needed for moderate pain or severe pain (Do not drive or operate machinery while taking as can cause drowsiness.)., Disp: 10 tablet, Rfl: 0   ROS:  Review of Systems  Constitutional: Positive for fatigue. Negative for fever and unexpected weight change.  Respiratory: Negative for cough, shortness of breath and wheezing.   Cardiovascular: Negative for chest pain, palpitations and leg swelling.    Gastrointestinal: Negative for blood in stool, constipation, diarrhea, nausea and vomiting.  Endocrine: Negative for cold intolerance, heat intolerance and polyuria.  Genitourinary: Negative for dyspareunia, dysuria, flank pain, frequency, genital sores, hematuria, menstrual problem, pelvic pain, urgency, vaginal bleeding, vaginal discharge and vaginal pain.  Musculoskeletal: Positive for arthralgias, back pain, neck pain and neck stiffness. Negative for joint swelling and myalgias.  Skin: Negative for rash.  Neurological: Positive for numbness. Negative for dizziness, syncope, light-headedness and headaches.  Hematological: Negative for adenopathy.  Psychiatric/Behavioral: Negative for agitation, confusion, sleep disturbance and suicidal ideas. The patient is not nervous/anxious.      Objective: BP 120/80   Pulse 61   Ht 5\' 4"  (1.626 m)   Wt 219 lb (99.3 kg)   BMI 37.59 kg/m    Physical Exam  Constitutional: She is oriented to person, place, and time. She appears well-developed and well-nourished.  Genitourinary: Vagina normal and uterus normal. There is no rash or tenderness on the right labia. There is no rash or tenderness on the left labia. No erythema or tenderness in the vagina. No vaginal discharge found. Right adnexum does not display mass and does not display tenderness. Left adnexum does not display mass and does not display tenderness. Cervix does not exhibit motion tenderness or polyp. Uterus is not enlarged or tender.  Neck: Normal range of motion. No thyromegaly present.  Cardiovascular: Normal rate, regular rhythm and normal heart sounds.   No murmur heard. Pulmonary/Chest: Effort normal and breath sounds normal. Right breast exhibits no mass, no nipple discharge, no skin change and no tenderness. Left breast exhibits no mass, no nipple discharge, no skin change and no tenderness.  Abdominal: Soft. There is no tenderness. There is no guarding.  Musculoskeletal: Normal  range of motion.  Neurological: She is alert and oriented to person, place, and time. No cranial nerve deficit.  Psychiatric: She has a normal mood and affect. Her behavior is normal.  Vitals reviewed.   Assessment/Plan: Encounter for annual routine gynecological examination  Cervical cancer screening - Plan: IGP, Aptima HPV  Screening for HPV (human papillomavirus) - Plan: IGP, Aptima HPV  Screening for breast cancer - Pt does mammos with PCP.  Left ovarian cyst - Stable for many yrs. Check GYN u/s. Will call pt with results. - Plan: US Transvaginal Non-OB  Screening for colon cancer - Refer back to Dr. Vira Agar for scr colonoscopy. - Plan: Ambulatory referral to Gastroenterology  GYN counsel mammography screening, adequate intake of calcium and vitamin D     F/U  Return in about 1 week (around 02/15/2017) for GYN u/s--[facility] to call pt.  Myli Pae B. [place]ep[place], PA-C 02/08/2017 2:59 PM

## 2017-02-10 LAB — IGP, APTIMA HPV
HPV Aptima: NEGATIVE
PAP SMEAR COMMENT: 0

## 2017-05-18 ENCOUNTER — Telehealth: Payer: Self-pay

## 2017-05-18 NOTE — Telephone Encounter (Signed)
Pt calling for results from last visit.  Adv pap was normal.  Pt mentioned she was supposed to have had an u/s done but hasn't.  Tx'd to SP to sched.

## 2017-05-23 ENCOUNTER — Ambulatory Visit (INDEPENDENT_AMBULATORY_CARE_PROVIDER_SITE_OTHER): Payer: Managed Care, Other (non HMO)

## 2017-05-23 DIAGNOSIS — N83202 Unspecified ovarian cyst, left side: Secondary | ICD-10-CM

## 2017-05-24 ENCOUNTER — Telehealth: Payer: Self-pay | Admitting: Obstetrics and Gynecology

## 2017-05-24 NOTE — Telephone Encounter (Signed)
Pt aware of stable LTO cyst. No real change from 10/15 u/s. Had neg ca-125 in 2013. Pt aware she should have yearly u/s to assess stability.

## 2017-06-07 ENCOUNTER — Other Ambulatory Visit: Payer: Self-pay | Admitting: Family Medicine

## 2017-06-07 DIAGNOSIS — Z1231 Encounter for screening mammogram for malignant neoplasm of breast: Secondary | ICD-10-CM

## 2017-06-28 ENCOUNTER — Ambulatory Visit
Admission: RE | Admit: 2017-06-28 | Discharge: 2017-06-28 | Disposition: A | Discharge status: Home / Self Care | Payer: Managed Care, Other (non HMO) | Source: Ambulatory Visit | Attending: Family Medicine | Admitting: Family Medicine

## 2017-06-28 DIAGNOSIS — Z1231 Encounter for screening mammogram for malignant neoplasm of breast: Secondary | ICD-10-CM | POA: Insufficient documentation

## 2017-09-22 ENCOUNTER — Ambulatory Visit: Payer: Managed Care, Other (non HMO) | Admitting: Obstetrics and Gynecology

## 2017-10-04 ENCOUNTER — Ambulatory Visit (INDEPENDENT_AMBULATORY_CARE_PROVIDER_SITE_OTHER): Payer: Managed Care, Other (non HMO) | Admitting: Obstetrics and Gynecology

## 2017-10-04 ENCOUNTER — Encounter: Payer: Self-pay | Admitting: Obstetrics and Gynecology

## 2017-10-04 VITALS — BP 134/90 | HR 111 | Ht 64.0 in | Wt 227.0 lb

## 2017-10-04 DIAGNOSIS — N950 Postmenopausal bleeding: Secondary | ICD-10-CM

## 2017-10-04 MED ORDER — MISOPROSTOL 100 MCG PO TABS: 100.0000 ug | ORAL_TABLET | Freq: Once | ORAL | 0 refills | Status: DC

## 2017-10-04 NOTE — Progress Notes (Signed)
Chief Complaint  Patient presents with  . Abnormal bleeding    x3 weeks recently stopped     HPI:      Emily Burgess is a 62 y.o. G2P1011 who LMP was No LMP recorded. Patient is postmenopausal., presents today for PMB that lasted for 3 wks. Blood was red and usually noticed with wiping, occas in underwear. No cramping/pelvic pain. Sx stopped a couple days ago. No vasomotor sx. Had viral gastroenteritis before sx started.   Was seen 7/18 for annual and denied PMB at that time. Neg pap/neg HPV DNA 7/18.  Hx of LTO cyst and had f/u u/s 10/18. EM=9.17 mm at 10/18 u/s but wasn't having any postmenopausal bleeding. Had stable 3.6 x 4.5 cm LTO cyst. Neg ca-125 2013.       Past Medical History:  Diagnosis Date  . Anxiety   . Degenerative disc disease, lumbar   . Heartburn   . Hematuria    microscopic  . History of mammogram   . History of Papanicolaou smear of cervix 05/30/2014   -/-  . Hypertension   . Insomnia   . Lumbago   . Melanoma ([place])    10 years ago resected from mid chest area.  . Neck pain 06/2015   hurt hand in elevator at work which pt now has tremendous neck pain.  Nerve block done in lower back.  Dr. Arvella Nigh  . Ovarian cyst 2007; 2012   left ovarian mass  . Vitamin D deficiency     Past Surgical History:  Procedure Laterality Date  . CESAREAN SECTION  1980  . ECTOPIC PREGNANCY SURGERY  1982-83  . NECK SURGERY  2016   fused and rod put in  . SALPINGECTOMY  1982   ectopic     Family History  Problem Relation Age of Onset  . Prostate cancer Cousin   . Hypertension Mother   . Diabetes Father        type 2  . Cancer Maternal Grandfather        bone   . Diabetes Paternal Grandmother        type 2  . Kidney disease Neg Hx   . Bladder Cancer Neg Hx   . Breast cancer Neg Hx     Social History   Socioeconomic History  . Marital status: Single    Spouse name: Not on file  . Number of children: 1  . Years of education: 56  . Highest  education level: Not on file  Social Needs  . Financial resource strain: Not on file  . Food insecurity - worry: Not on file  . Food insecurity - inability: Not on file  . Transportation needs - medical: Not on file  . Transportation needs - non-medical: Not on file  Occupational History  . Occupation: Engineer, structural  Tobacco Use  . Smoking status: Never Smoker  . Smokeless tobacco: Never Used  Substance and Sexual Activity  . Alcohol use: No    Alcohol/week: 0.0 oz  . Drug use: No  . Sexual activity: Not Currently    Birth control/protection: Post-menopausal  Other Topics Concern  . Not on file  Social History Narrative  . Not on file     Current Outpatient Medications:  .  aspirin EC 81 MG tablet, Take by mouth., Disp: , Rfl:  .  atenolol (TENORMIN) 50 MG tablet, TAKE 1 TABLET (50 MG TOTAL) BY MOUTH ONCE DAILY., Disp: , Rfl: 2 .  ibuprofen (ADVIL,MOTRIN)  200 MG tablet, Take 800 mg by mouth daily as needed., Disp: , Rfl:  .  lisinopril-hydrochlorothiazide (PRINZIDE,ZESTORETIC) 10-12.5 MG tablet, Take 1 tablet by mouth daily., Disp: , Rfl: 2 .  ranitidine (ZANTAC) 150 MG tablet, Take by mouth., Disp: , Rfl:  .  albuterol (PROVENTIL HFA;VENTOLIN HFA) 108 (90 Base) MCG/ACT inhaler, Inhale 2 puffs into the lungs every 6 (six) hours as needed for wheezing or shortness of breath. (Patient not taking: Reported on 10/04/2017), Disp: 1 Inhaler, Rfl: 0 .  misoprostol (CYTOTEC) 100 MCG tablet, Take 1 tablet (100 mcg total) by mouth once for 1 dose. 12 hours before appt, Disp: 1 tablet, Rfl: 0 .  predniSONE (DELTASONE) 10 MG tablet, Start 60 mg po day one, then 50 mg po day two, taper by 10 mg daily until complete. (Patient not taking: Reported on 10/04/2017), Disp: 21 tablet, Rfl: 0 .  traMADol (ULTRAM) 50 MG tablet, Take 1 tablet (50 mg total) by mouth every 8 (eight) hours as needed for moderate pain or severe pain (Do not drive or operate machinery while taking as can cause drowsiness.).  (Patient not taking: Reported on 10/04/2017), Disp: 10 tablet, Rfl: 0   ROS:  Review of Systems  Constitutional: Negative for fever.  Gastrointestinal: Negative for blood in stool, constipation, diarrhea, nausea and vomiting.  Genitourinary: Positive for vaginal bleeding. Negative for dyspareunia, dysuria, flank pain, frequency, hematuria, urgency, vaginal discharge and vaginal pain.  Musculoskeletal: Positive for arthralgias. Negative for back pain.  Skin: Negative for rash.     OBJECTIVE:   Vitals:  BP 134/90   Pulse (!) 111   Ht 5\' 4"  (1.626 m)   Wt 227 lb (103 kg)   BMI 38.96 kg/m   Physical Exam  Constitutional: She is oriented to person, place, and time and well-developed, well-nourished, and in no distress. Vital signs are normal.  Pulmonary/Chest: Effort normal.  Genitourinary: Vagina normal, uterus normal, cervix normal, right adnexa normal, left adnexa normal and vulva normal. Uterus is not enlarged. Cervix exhibits no motion tenderness and no tenderness. Right adnexum displays no mass and no tenderness. Left adnexum displays no mass and no tenderness. Vulva exhibits no erythema, no exudate, no lesion, no rash and no tenderness. Vagina exhibits no lesion.  Genitourinary Comments: CX WITHOUT ANY LESIONS/POLYPS  Musculoskeletal: Normal range of motion.  Neurological: She is oriented to person, place, and time.  Psychiatric: Memory, affect and judgment normal.  Vitals reviewed.  EMB ATTEMPTED BUT CX OS STENOTIC   Assessment/Plan: Postmenopausal bleeding - Light flow for 3 wks. EMB attempted but cx stenotic. Rx cytotec. RTO with [facility] for bx/mgmt.    Meds ordered this encounter  Medications  . misoprostol (CYTOTEC) 100 MCG tablet    Sig: Take 1 tablet (100 mcg total) by mouth once for 1 dose. 12 hours before appt    Dispense:  1 tablet    Refill:  0    Order Specific Question:   Supervising Provider    Answer:   Gae Dry [[id number]]     Return in about 3 days  (around 10/07/2017) for with [facility] for EMB/PMB.  Finneas Mathe B. Allex Madia, PA-C 10/04/2017 12:03 PM

## 2017-10-04 NOTE — Patient Instructions (Signed)
I value your feedback and entrusting us with your care. If you get a [facility] patient survey, I would appreciate you taking the time to let us know about your experience today. Thank you! 

## 2017-10-07 ENCOUNTER — Encounter: Payer: Self-pay | Admitting: Obstetrics & Gynecology

## 2017-10-07 ENCOUNTER — Ambulatory Visit (INDEPENDENT_AMBULATORY_CARE_PROVIDER_SITE_OTHER): Payer: Managed Care, Other (non HMO) | Admitting: Obstetrics & Gynecology

## 2017-10-07 VITALS — BP 130/90 | HR 74 | Ht 64.0 in | Wt 228.0 lb

## 2017-10-07 DIAGNOSIS — N950 Postmenopausal bleeding: Secondary | ICD-10-CM | POA: Diagnosis not present

## 2017-10-07 NOTE — Progress Notes (Signed)
  Endometrial Biopsy After discussion with the patient regarding her abnormal uterine bleeding I recommended that she proceed with an endometrial biopsy for further diagnosis. The risks, benefits, alternatives, and indications for an endometrial biopsy were discussed with the patient in detail. She understood the risks including infection, bleeding, cervical laceration and uterine perforation.  Verbal consent was obtained.   PROCEDURE NOTE:  The uterus was unable to be sounded due to cervical stenosis. No sampling was obtained with minimal blood loss.  The patient tolerated the procedure well with pain with the procedure.  Discontinued due to inability to pass through cervix.  Pt noted to not be on any recent new meds, hormones, or blood thinners.  No other instigating factors. No associated sx's with her bleeding.  Bleeding has been constant and noted only w wiping for the last month.  US showed 9 mm ES (in Oct 2018).  Options are to sample and also eval for polyp by D&C with hysteroscopy, vs waiting to see if pattern of bleeding develops with repeat US assessment of lining (although this is without a tissue diagnosis to rule out cancer).  [place] next week, as last [place] was in Oct 2018.  Base D&C on this result.  Barnett Applebaum, MD, [facility], [facility] 10/07/2017  4:07 PM

## 2017-10-07 NOTE — Patient Instructions (Addendum)

## 2017-10-13 ENCOUNTER — Encounter: Payer: Self-pay | Admitting: Obstetrics & Gynecology

## 2017-10-13 ENCOUNTER — Ambulatory Visit (INDEPENDENT_AMBULATORY_CARE_PROVIDER_SITE_OTHER): Payer: Managed Care, Other (non HMO)

## 2017-10-13 ENCOUNTER — Ambulatory Visit: Payer: Managed Care, Other (non HMO) | Admitting: Obstetrics & Gynecology

## 2017-10-13 VITALS — BP 128/80 | HR 81 | Ht 64.0 in | Wt 224.0 lb

## 2017-10-13 DIAGNOSIS — N950 Postmenopausal bleeding: Secondary | ICD-10-CM

## 2017-10-13 DIAGNOSIS — N83202 Unspecified ovarian cyst, left side: Secondary | ICD-10-CM

## 2017-10-13 NOTE — Progress Notes (Signed)
  HPI: Pt is a 62 yo G2)1011 WF with PMB that lasted for 3 wks. Blood was red and usually noticed with wiping, occas in underwear. No cramping/pelvic pain. Sx stopped a couple days ago. No vasomotor sx. Had viral gastroenteritis before sx started. Prior endometrial thickening by ultrasound.  EMB attempted twice without success due to stenosis of cervix.  Ultrasound demonstrates cyst seen left ovary, ES 7.29mm.  PMHx: She  has a past medical history of Anxiety, Degenerative disc disease, lumbar, Heartburn, Hematuria, History of mammogram, History of Papanicolaou smear of cervix (05/30/2014), Hypertension, Insomnia, Lumbago, Melanoma ([place]), Neck pain (06/2015), Ovarian cyst (2007; 2012), and Vitamin D deficiency. Also,  has a past surgical history that includes Cesarean section (1980); Ectopic pregnancy surgery (1982-83); Salpingectomy (1982); and Neck surgery (2016)., family history includes Cancer in her maternal grandfather; Diabetes in her father and paternal grandmother; Hypertension in her mother; Prostate cancer in her cousin.,  reports that  has never smoked. she has never used smokeless tobacco. She reports that she does not drink alcohol or use drugs.  She has a current medication list which includes the following prescription(s): albuterol, aspirin ec, atenolol, ibuprofen, lisinopril-hydrochlorothiazide, misoprostol, prednisone, ranitidine, and tramadol. Also, has No Known Allergies.  Review of Systems  All other systems reviewed and are negative.  Objective: BP 128/80   Pulse 81   Ht 5\' 4"  (1.626 m)   Wt 224 lb (101.6 kg)   BMI 38.45 kg/m   Physical examination Constitutional NAD, Conversant  Skin No rashes, lesions or ulceration.   Extremities: Moves all appropriately.  Normal ROM for age. No lymphadenopathy.  Neuro: Grossly intact  Psych: Oriented to PPT.  Normal mood. Normal affect.   [place] RESULTS: Indications:PMB, f/u endometrial thickening Findings:  The uterus is  anteverted and measures 6.04 x 4.16 x 3.28cm. Echo texture is heterogenous without evidence of focal masses. The Endometrium measures 7.41mm. Right Ovary is not identified. Left Ovary measures 5.30 x 4.51 x 4.90 cm. It is not normal in appearance. - there is a cyst measuring(4.83 x 3.91), stable in size from last ultrasound Survey of the adnexa demonstrates no adnexal masses. There is no free fluid in the cul de sac. Impression: 1. Left ovarian cyst 2. Thickened endometrium  Assessment:  Post-menopausal bleeding & Cyst of left ovary  Options for further determining etiology of PMB discussed; D&C pros and cons. Also, persistance of left ovarian cyst, could remove by laparoscopy and thus remove possibility of it causing her the lower abd pains she has been having as well as remove risk of cancer of ovary (would do BSO).  Pros and cons of lap surgery discussed.  Would need gen anesthesia which she is afraid of due to past problems w anesthesia.  Pt to have GI endoscopy in near future to assess pain; may wait to see results prior so as to see need for BSO or not.  Will call to schedule D&C.  A total of 15 minutes were spent face-to-face with the patient during this encounter and over half of that time dealt with counseling and coordination of care.  Barnett Applebaum, MD, [facility], [facility] 10/13/2017  2:25 PM

## 2017-10-13 NOTE — Patient Instructions (Signed)
Dilation and Curettage or Vacuum Curettage Dilation and curettage (D&C) and vacuum curettage are minor procedures. A D&C involves stretching (dilation) the cervix and scraping (curettage) the inside lining of the uterus (endometrium). During a D&C, tissue is gently scraped from the endometrium, starting from the top portion of the uterus down to the lowest part of the uterus (cervix). During a vacuum curettage, the lining and tissue in the uterus are removed with the use of gentle suction. Curettage may be performed to either diagnose or treat a problem. As a diagnostic procedure, curettage is performed to examine tissues from the uterus. A diagnostic curettage may be done if you have:  Irregular bleeding in the uterus.  Bleeding with the development of clots.  Spotting between menstrual periods.  Prolonged menstrual periods or other abnormal bleeding.  Bleeding after menopause.  No menstrual period (amenorrhea).  A change in size and shape of the uterus.  Abnormal endometrial cells discovered during a Pap test.  As a treatment procedure, curettage may be performed for the following reasons:  Removal of an IUD (intrauterine device).  Removal of retained placenta after giving birth.  Abortion.  Miscarriage.  Removal of endometrial polyps.  Removal of uncommon types of noncancerous lumps (fibroids).  Tell a health care provider about:  Any allergies you have, including allergies to prescribed medicine or latex.  All medicines you are taking, including vitamins, herbs, eye drops, creams, and over-the-counter medicines. This is especially important if you take any blood-thinning medicine. Bring a list of all of your medicines to your appointment.  Any problems you or family members have had with anesthetic medicines.  Any blood disorders you have.  Any surgeries you have had.  Your medical history and any medical conditions you have.  Whether you are pregnant or may be  pregnant.  Recent vaginal infections you have had.  Recent menstrual periods, bleeding problems you have had, and what form of birth control (contraception) you use. What are the risks? Generally, this is a safe procedure. However, problems may occur, including:  Infection.  Heavy vaginal bleeding.  Allergic reactions to medicines.  Damage to the cervix or other structures or organs.  Development of scar tissue (adhesions) inside the uterus, which can cause abnormal amounts of menstrual bleeding. This may make it harder to get pregnant in the future.  A hole (perforation) or puncture in the uterine wall. This is rare.  What happens before the procedure? Staying hydrated Follow instructions from your health care provider about hydration, which may include:  Up to 2 hours before the procedure - you may continue to drink clear liquids, such as water, clear fruit juice, black coffee, and plain tea.  Eating and drinking restrictions Follow instructions from your health care provider about eating and drinking, which may include:  8 hours before the procedure - stop eating heavy meals or foods such as meat, fried foods, or fatty foods.  6 hours before the procedure - stop eating light meals or foods, such as toast or cereal.  6 hours before the procedure - stop drinking milk or drinks that contain milk.  2 hours before the procedure - stop drinking clear liquids. If your health care provider told you to take your medicine(s) on the day of your procedure, take them with only a sip of water.  Medicines  Ask your health care provider about: ? Changing or stopping your regular medicines. This is especially important if you are taking diabetes medicines or blood thinners. ? Taking   medicines such as aspirin and ibuprofen. These medicines can thin your blood. Do not take these medicines before your procedure if your health care provider instructs you not to.  You may be given antibiotic  medicine to help prevent infection. General instructions  For 24 hours before your procedure, do not: ? Douche. ? Use tampons. ? Use medicines, creams, or suppositories in the vagina. ? Have sexual intercourse.  You may be given a pregnancy test on the day of the procedure.  Plan to have someone take you home from the hospital or clinic.  You may have a blood or urine sample taken.  If you will be going home right after the procedure, plan to have someone with you for 24 hours. What happens during the procedure?  To reduce your risk of infection: ? Your health care team will wash or sanitize their hands. ? Your skin will be washed with soap.  An IV tube will be inserted into one of your veins.  You will be given one of the following: ? A medicine that numbs the area in and around the cervix (local anesthetic). ? A medicine to make you fall asleep (general anesthetic).  You will lie down on your back, with your feet in foot rests (stirrups).  The size and position of your uterus will be checked.  A lubricated instrument (speculum or Sims retractor) will be inserted into the back side of your vagina. The speculum will be used to hold apart the walls of your vagina so your health care provider can see your cervix.  A tool (tenaculum) will be attached to the lip of the cervix to stabilize it.  Your cervix will be softened and dilated. This may be done by: ? Taking a medicine. ? Having tapered dilators or thin rods (laminaria) or gradual widening instruments (tapered dilators) inserted into your cervix.  A small, sharp, curved instrument (curette) will be used to scrape a small amount of tissue or cells from the endometrium or cervical canal. In some cases, gentle suction is applied with the curette. The curette will then be removed. The cells will be taken to a lab for testing. The procedure may vary among health care providers and hospitals. What happens after the  procedure?  You may have mild cramping, backache, pain, and light bleeding or spotting. You may pass small blood clots from your vagina.  You may have to wear compression stockings. These stockings help to prevent blood clots and reduce swelling in your legs.  Your blood pressure, heart rate, breathing rate, and blood oxygen level will be monitored until the medicines you were given have worn off. Summary  Dilation and curettage (D&C) involves stretching (dilation) the cervix and scraping (curettage) the inside lining of the uterus (endometrium).  After the procedure, you may have mild cramping, backache, pain, and light bleeding or spotting. You may pass small blood clots from your vagina.  Plan to have someone take you home from the hospital or clinic. This information is not intended to replace advice given to you by your health care provider. Make sure you discuss any questions you have with your health care provider. Document Released: 07/19/2005 Document Revised: 04/04/2016 Document Reviewed: 04/04/2016 Elsevier Interactive Patient Education  2018 [employer]  

## 2017-10-24 ENCOUNTER — Telehealth: Payer: Self-pay

## 2017-10-24 NOTE — Telephone Encounter (Signed)
Pt needs to schedule D&C after her GI appt.  Her GI appt is on 11/02/17.  Also, she has started bleeding again.  [phone]

## 2017-10-24 NOTE — Telephone Encounter (Signed)
If she only wants/needs a D&C, then schedule for April 19 (first choice, if we can do OR cases this good Friday), or any other date April 11 or after. If she is considering having her ovaries removed, then would need to wait til after results from GI scopes on 11/02/17.  I dont think she wants this done regardless, but can check.  Surgery Booking Request Patient Full Name:  Emily Burgess  MRN: [id number]  DOB: [birth year]  Surgeon: Hoyt Koch, MD  Requested Surgery Date and Time: April 19 Primary Diagnosis AND Code: PMB Secondary Diagnosis and Code:  Surgical Procedure: D&C L&D Notification: No Admission Status: same day surgery Length of Surgery: 20 min Special Case Needs: small dilators H&P:  yes (date) Phone Interview???: yes Interpreter: Language:  Medical Clearance: no Special Scheduling Instructions: no

## 2017-10-25 NOTE — Telephone Encounter (Signed)
Patient is aware of H&P at [facility] on 11/08/17 @ 1:50pm, Pre-admit Testing phone interview to be scheduled, and OR on 11/15/17. Patient is aware she may receive calls from the [place] and [facility]. Patient will discuss w/ Dr Kenton Kingfisher on 11/18/17 whether to add removal of ovaries. Patient said she is afraid of being put to sleep and would like an epidural. Ext given.

## 2017-11-08 ENCOUNTER — Encounter: Payer: Self-pay | Admitting: Obstetrics & Gynecology

## 2017-11-08 ENCOUNTER — Other Ambulatory Visit: Payer: Self-pay

## 2017-11-08 ENCOUNTER — Encounter
Admission: RE | Admit: 2017-11-08 | Discharge: 2017-11-08 | Disposition: A | Discharge status: Home / Self Care | Payer: Managed Care, Other (non HMO) | Source: Ambulatory Visit | Attending: Obstetrics & Gynecology | Admitting: Obstetrics & Gynecology

## 2017-11-08 ENCOUNTER — Ambulatory Visit (INDEPENDENT_AMBULATORY_CARE_PROVIDER_SITE_OTHER): Payer: Managed Care, Other (non HMO) | Admitting: Obstetrics & Gynecology

## 2017-11-08 VITALS — BP 130/80 | HR 65 | Ht 64.0 in | Wt 228.0 lb

## 2017-11-08 DIAGNOSIS — N950 Postmenopausal bleeding: Secondary | ICD-10-CM | POA: Diagnosis not present

## 2017-11-08 HISTORY — DX: Headache, unspecified: R51.9

## 2017-11-08 HISTORY — DX: Gastro-esophageal reflux disease without esophagitis: K21.9

## 2017-11-08 HISTORY — DX: Cardiac murmur, unspecified: R01.1

## 2017-11-08 HISTORY — DX: Personal history of other diseases of the digestive system: Z87.19

## 2017-11-08 HISTORY — DX: Headache: R51

## 2017-11-08 HISTORY — DX: Other complications of anesthesia, initial encounter: T88.59XA

## 2017-11-08 HISTORY — DX: Adverse effect of unspecified anesthetic, initial encounter: T41.45XA

## 2017-11-08 MED ORDER — DIAZEPAM 5 MG PO TABS: 5.0000 mg | ORAL_TABLET | Freq: Once | ORAL | 0 refills | Status: AC

## 2017-11-08 NOTE — Pre-Procedure Instructions (Signed)
ECG 12-lead2/27/2019 [place] Component Name Value Ref Range  Vent Rate (bpm) 65   PR Interval (msec) 150   QRS Interval (msec) 74   QT Interval (msec) 412   QTc (msec) 428   Other Result Information  This result has an attachment that is not available.  Result Narrative  Normal sinus rhythm Normal ECG  When compared with ECG of 25-Mar-2016 10:29, No significant change was found I reviewed and concur with this report. Electronically signed YN:WGNFA, MD, AUGUSTUS ([id number]) on 09/28/2017 7:58:26 PM  Status Results Details   Encounter Summary

## 2017-11-08 NOTE — H&P (View-Only) (Signed)
PRE-OPERATIVE HISTORY AND PHYSICAL EXAM  HPI:  Emily Burgess is a 62 y.o. G2P1011 No LMP recorded. Patient is postmenopausal.; she is being admitted for surgery related to PMB.   Blood was red and usually noticed with wiping, occas in underwear. No cramping/pelvic pain. Sx has since stopped. No vasomotor sx. Had viral gastroenteritis before sx started. Prior endometrial thickening by ultrasound. EMB attempted twice without success due to stenosis of cervix.  Ultrasound demonstrates cyst seen left ovary, ES 7.70mm. She has had ovarian cyst for years, last [place] 05/2017 and same size.   PMHx: Past Medical History:  Diagnosis Date  . Anxiety   . Degenerative disc disease, lumbar   . Heartburn   . Hematuria    microscopic  . History of mammogram   . History of Papanicolaou smear of cervix 05/30/2014   -/-  . Hypertension   . Insomnia   . Lumbago   . Melanoma ([place])    10 years ago resected from mid chest area.  . Neck pain 06/2015   hurt hand in elevator at work which pt now has tremendous neck pain.  Nerve block done in lower back.  Dr. Arvella Nigh  . Ovarian cyst 2007; 2012   left ovarian mass  . Vitamin D deficiency    Past Surgical History:  Procedure Laterality Date  . CESAREAN SECTION  1980  . ECTOPIC PREGNANCY SURGERY  1982-83  . NECK SURGERY  2016   fused and rod put in  . SALPINGECTOMY  1982   ectopic    Family History  Problem Relation Age of Onset  . Prostate cancer Cousin   . Hypertension Mother   . Diabetes Father        type 2  . Cancer Maternal Grandfather        bone   . Diabetes Paternal Grandmother        type 2  . Kidney disease Neg Hx   . Bladder Cancer Neg Hx   . Breast cancer Neg Hx    Social History   Tobacco Use  . Smoking status: Never Smoker  . Smokeless tobacco: Never Used  Substance Use Topics  . Alcohol use: No    Alcohol/week: 0.0 oz  . Drug use: No    Current Outpatient Medications:  .  acetaminophen (TYLENOL) 500 MG tablet,  Take 1,500-2,000 mg by mouth every 6 (six) hours as needed (FOR PAIN.)., Disp: , Rfl:  .  aspirin EC 81 MG tablet, Take 81 mg by mouth at bedtime. , Disp: , Rfl:  .  atenolol (TENORMIN) 50 MG tablet, TAKE 1 TABLET (50 MG TOTAL) BY MOUTH ONCE DAILY AT BEDTIME., Disp: , Rfl: 2 .  diazepam (VALIUM) 5 MG tablet, Take 1 tablet (5 mg total) by mouth once for 1 dose. One hour prior to procedure, Disp: 2 tablet, Rfl: 0 .  ibuprofen (ADVIL,MOTRIN) 200 MG tablet, Take 600-800 mg by mouth every 8 (eight) hours as needed (FOR PAIN.). , Disp: , Rfl:  .  lisinopril-hydrochlorothiazide (PRINZIDE,ZESTORETIC) 10-12.5 MG tablet, Take 1 tablet by mouth at bedtime. , Disp: , Rfl: 2 .  ranitidine (ZANTAC) 150 MG tablet, Take 150 mg by mouth 2 (two) times daily as needed for heartburn. , Disp: , Rfl:  Allergies: Patient has no known allergies.  Review of Systems  Constitutional: Negative for chills, fever and malaise/fatigue.  HENT: Negative for congestion, sinus pain and sore throat.   Eyes: Negative for blurred vision and pain.  Respiratory: Negative for cough and wheezing.   Cardiovascular: Negative for chest pain and leg swelling.  Gastrointestinal: Negative for abdominal pain, constipation, diarrhea, heartburn, nausea and vomiting.  Genitourinary: Negative for dysuria, frequency, hematuria and urgency.  Musculoskeletal: Negative for back pain, joint pain, myalgias and neck pain.  Skin: Negative for itching and rash.  Neurological: Negative for dizziness, tremors and weakness.  Endo/Heme/Allergies: Does not bruise/bleed easily.  Psychiatric/Behavioral: Negative for depression. The patient is not nervous/anxious and does not have insomnia.     Objective: BP 130/80   Pulse 65   Ht 5\' 4"  (1.626 m)   Wt 228 lb (103.4 kg)   BMI 39.14 kg/m   Filed Weights   11/08/17 1346  Weight: 228 lb (103.4 kg)   Physical Exam  Constitutional: She is oriented to person, place, and time. She appears well-developed and  well-nourished. No distress.  HENT:  Head: Normocephalic and atraumatic. Head is without laceration.  Right Ear: Hearing normal.  Left Ear: Hearing normal.  Nose: No epistaxis.  No foreign bodies.  Mouth/Throat: Uvula is midline, oropharynx is clear and moist and mucous membranes are normal.  Eyes: Pupils are equal, round, and reactive to light.  Neck: Normal range of motion. Neck supple. No thyromegaly present.  Cardiovascular: Normal rate and regular rhythm. Exam reveals no gallop and no friction rub.  No murmur heard. Pulmonary/Chest: Effort normal and breath sounds normal. No respiratory distress. She has no wheezes. Right breast exhibits no mass, no skin change and no tenderness. Left breast exhibits no mass, no skin change and no tenderness.  Abdominal: Soft. Bowel sounds are normal. She exhibits no distension. There is no tenderness. There is no rebound.  Musculoskeletal: Normal range of motion.  Neurological: She is alert and oriented to person, place, and time. No cranial nerve deficit.  Skin: Skin is warm and dry.  Psychiatric: She has a normal mood and affect. Judgment normal.  Vitals reviewed.  Assessment: 1. Post-menopausal bleeding   Need sampling to rule out hyperplasia or cancer Pt counseled on ability to remove ovaries by laparoscopy, but as cyst is stable and present for years, she prefers to leave alone.  I have had a careful discussion with this patient about all the options available and the risk/benefits of each. I have fully informed this patient that surgery may subject her to a variety of discomforts and risks: She understands that most patients have surgery with little difficulty, but problems can happen ranging from minor to fatal. These include nausea, vomiting, pain, bleeding, infection, poor healing, hernia, or formation of adhesions. Unexpected reactions may occur from any drug or anesthetic given. Unintended injury may occur to other pelvic or abdominal  structures such as Fallopian tubes, ovaries, bladder, ureter (tube from kidney to bladder), or bowel. Nerves going from the pelvis to the legs may be injured. Any such injury may require immediate or later additional surgery to correct the problem. Excessive blood loss requiring transfusion is very unlikely but possible. Dangerous blood clots may form in the legs or lungs. Physical and sexual activity will be restricted in varying degrees for an indeterminate period of time but most often 2-6 weeks.  Finally, she understands that it is impossible to list every possible undesirable effect and that the condition for which surgery is done is not always cured or significantly improved, and in rare cases may be even worse.Ample time was given to answer all questions.  Barnett Applebaum, MD, [facility], [facility] 11/08/2017  2:10 PM

## 2017-11-08 NOTE — Patient Instructions (Signed)
General Gynecological Post-Operative Instructions You may expect to feel dizzy, weak, and drowsy for as long as 24 hours after receiving the medicine that made you sleep (anesthetic).  Do not drive a car, ride a bicycle, participate in physical activities, or take public transportation until you are done taking narcotic pain medicines or as directed by your doctor.  Do not drink alcohol or take tranquilizers.  Do not take medicine that has not been prescribed by your doctor.  Do not sign important papers or make important decisions while on narcotic pain medicines.  Have a responsible person with you.  CARE OF INCISION  Take showers instead of baths until your doctor gives you permission to take baths.  No sexual intercourse or placement of anything in the vagina for 1 weeks or as instructed by your doctor. Only take prescription or over-the-counter medicines  for pain, discomfort, or fever as directed by your doctor. Do not take aspirin. It can make you bleed. Take medicines (antibiotics) that kill germs if they are prescribed for you.  Call the office or go to the ER if:  You feel sick to your stomach (nauseous) and you start to throw up (vomit).  You have trouble eating or drinking.  You have an oral temperature above 101.  You have constipation that is not helped by adjusting diet or increasing fluid intake. Pain medicines are a common cause of constipation.  You have any other concerns. SEEK IMMEDIATE MEDICAL CARE IF:  You have persistent dizziness.  You have difficulty breathing or a congested sounding (croupy) cough.  You have an oral temperature above 102.5, not controlled by medicine.  There is increasing pain or tenderness near or in the surgical site.      

## 2017-11-08 NOTE — Patient Instructions (Signed)
Your procedure is scheduled on: 11-15-17 TUESDAY Report to Same Day Surgery 2nd floor medical mall [facility] Entrance-take elevator on left to 2nd floor.  Check in with surgery information desk.) To find out your arrival time please call [phone] between 1PM - 3PM on 11-14-17 MONDAY  Remember: Instructions that are not followed completely may result in serious medical risk, up to and including death, or upon the discretion of your surgeon and anesthesiologist your surgery may need to be rescheduled.    _x___ 1. Do not eat food after midnight the night before your procedure. NO GUM OR CANDY AFTER MIDNIGHT.  You may drink clear liquids up to 2 hours before you are scheduled to arrive at the hospital for your procedure.  Do not drink clear liquids within 2 hours of your scheduled arrival to the hospital.  Clear liquids include  --Water or Apple juice without pulp  --Clear carbohydrate beverage such as ClearFast or Gatorade  --Black Coffee or Clear Tea (No milk, no creamers, do not add anything to  the coffee or Tea    __x__ 2. No Alcohol for 24 hours before or after surgery.   __x__3. No Smoking or e-cigarettes for 24 prior to surgery.  Do not use any chewable tobacco products for at least 6 hour prior to surgery   ____  4. Bring all medications with you on the day of surgery if instructed.    __x__ 5. Notify your doctor if there is any change in your medical condition     (cold, fever, infections).    x___6. On the morning of surgery brush your teeth with toothpaste and water.  You may rinse your mouth with mouth wash if you wish.  Do not swallow any toothpaste or mouthwash.   Do not wear jewelry, make-up, hairpins, clips or nail polish.  Do not wear lotions, powders, or perfumes. You may wear deodorant.  Do not shave 48 hours prior to surgery. Men may shave face and neck.  Do not bring valuables to the hospital.    [facility] is not responsible for any belongings or  valuables.               Contacts, dentures or bridgework may not be worn into surgery.  Leave your suitcase in the car. After surgery it may be brought to your room.  For patients admitted to the hospital, discharge time is determined by your treatment team.  _  Patients discharged the day of surgery will not be allowed to drive home.  You will need someone to drive you home and stay with you the night of your procedure.    Please read over the following fact sheets that you were given:   [facility] Preparing for Surgery and or MRSA Information   _x___ TAKE THE FOLLOWING MEDICATION THE MORNING OF SURGERY . These include:  1. ZANTAC (RANITIDINE)  2. TAKE A ZANTAC THE NIGHT BEFORE YOUR SURGERY  3.  4.  5.  6.  ____Fleets enema or Magnesium Citrate as directed.   ____ Use CHG Soap or sage wipes as directed on instruction sheet   ____ Use inhalers on the day of surgery and bring to hospital day of surgery  ____ Stop Metformin and Janumet 2 days prior to surgery.    ____ Take 1/2 of usual insulin dose the night before surgery and none on the morning surgery.   _x___ Follow recommendations from Cardiologist, Pulmonologist or PCP regarding stopping Aspirin, Coumadin, Plavix ,Eliquis,  Effient, or Pradaxa, and Pletal-STOP YOUR ASPIRIN NOW  X____Stop Anti-inflammatories such as Advil, Aleve, Ibuprofen, Motrin, Naproxen, Naprosyn, Goodies powders or aspirin products NOW-OK to take Tylenol    ____ Stop supplements until after surgery.     ____ Bring C-Pap to the hospital.

## 2017-11-08 NOTE — Progress Notes (Signed)
PRE-OPERATIVE HISTORY AND PHYSICAL EXAM  HPI:  Emily Burgess is a 62 y.o. G2P1011 No LMP recorded. Patient is postmenopausal.; she is being admitted for surgery related to PMB.   Blood was red and usually noticed with wiping, occas in underwear. No cramping/pelvic pain. Sx has since stopped. No vasomotor sx. Had viral gastroenteritis before sx started. Prior endometrial thickening by ultrasound. EMB attempted twice without success due to stenosis of cervix.  Ultrasound demonstrates cyst seen left ovary, ES 7.63mm. She has had ovarian cyst for years, last [place] 05/2017 and same size.   PMHx: Past Medical History:  Diagnosis Date  . Anxiety   . Degenerative disc disease, lumbar   . Heartburn   . Hematuria    microscopic  . History of mammogram   . History of Papanicolaou smear of cervix 05/30/2014   -/-  . Hypertension   . Insomnia   . Lumbago   . Melanoma ([place])    10 years ago resected from mid chest area.  . Neck pain 06/2015   hurt hand in elevator at work which pt now has tremendous neck pain.  Nerve block done in lower back.  Dr. Arvella Nigh  . Ovarian cyst 2007; 2012   left ovarian mass  . Vitamin D deficiency    Past Surgical History:  Procedure Laterality Date  . CESAREAN SECTION  1980  . ECTOPIC PREGNANCY SURGERY  1982-83  . NECK SURGERY  2016   fused and rod put in  . SALPINGECTOMY  1982   ectopic    Family History  Problem Relation Age of Onset  . Prostate cancer Cousin   . Hypertension Mother   . Diabetes Father        type 2  . Cancer Maternal Grandfather        bone   . Diabetes Paternal Grandmother        type 2  . Kidney disease Neg Hx   . Bladder Cancer Neg Hx   . Breast cancer Neg Hx    Social History   Tobacco Use  . Smoking status: Never Smoker  . Smokeless tobacco: Never Used  Substance Use Topics  . Alcohol use: No    Alcohol/week: 0.0 oz  . Drug use: No    Current Outpatient Medications:  .  acetaminophen (TYLENOL) 500 MG tablet,  Take 1,500-2,000 mg by mouth every 6 (six) hours as needed (FOR PAIN.)., Disp: , Rfl:  .  aspirin EC 81 MG tablet, Take 81 mg by mouth at bedtime. , Disp: , Rfl:  .  atenolol (TENORMIN) 50 MG tablet, TAKE 1 TABLET (50 MG TOTAL) BY MOUTH ONCE DAILY AT BEDTIME., Disp: , Rfl: 2 .  diazepam (VALIUM) 5 MG tablet, Take 1 tablet (5 mg total) by mouth once for 1 dose. One hour prior to procedure, Disp: 2 tablet, Rfl: 0 .  ibuprofen (ADVIL,MOTRIN) 200 MG tablet, Take 600-800 mg by mouth every 8 (eight) hours as needed (FOR PAIN.). , Disp: , Rfl:  .  lisinopril-hydrochlorothiazide (PRINZIDE,ZESTORETIC) 10-12.5 MG tablet, Take 1 tablet by mouth at bedtime. , Disp: , Rfl: 2 .  ranitidine (ZANTAC) 150 MG tablet, Take 150 mg by mouth 2 (two) times daily as needed for heartburn. , Disp: , Rfl:  Allergies: Patient has no known allergies.  Review of Systems  Constitutional: Negative for chills, fever and malaise/fatigue.  HENT: Negative for congestion, sinus pain and sore throat.   Eyes: Negative for blurred vision and pain.  Respiratory: Negative for cough and wheezing.   Cardiovascular: Negative for chest pain and leg swelling.  Gastrointestinal: Negative for abdominal pain, constipation, diarrhea, heartburn, nausea and vomiting.  Genitourinary: Negative for dysuria, frequency, hematuria and urgency.  Musculoskeletal: Negative for back pain, joint pain, myalgias and neck pain.  Skin: Negative for itching and rash.  Neurological: Negative for dizziness, tremors and weakness.  Endo/Heme/Allergies: Does not bruise/bleed easily.  Psychiatric/Behavioral: Negative for depression. The patient is not nervous/anxious and does not have insomnia.     Objective: BP 130/80   Pulse 65   Ht 5\' 4"  (1.626 m)   Wt 228 lb (103.4 kg)   BMI 39.14 kg/m   Filed Weights   11/08/17 1346  Weight: 228 lb (103.4 kg)   Physical Exam  Constitutional: She is oriented to person, place, and time. She appears well-developed and  well-nourished. No distress.  HENT:  Head: Normocephalic and atraumatic. Head is without laceration.  Right Ear: Hearing normal.  Left Ear: Hearing normal.  Nose: No epistaxis.  No foreign bodies.  Mouth/Throat: Uvula is midline, oropharynx is clear and moist and mucous membranes are normal.  Eyes: Pupils are equal, round, and reactive to light.  Neck: Normal range of motion. Neck supple. No thyromegaly present.  Cardiovascular: Normal rate and regular rhythm. Exam reveals no gallop and no friction rub.  No murmur heard. Pulmonary/Chest: Effort normal and breath sounds normal. No respiratory distress. She has no wheezes. Right breast exhibits no mass, no skin change and no tenderness. Left breast exhibits no mass, no skin change and no tenderness.  Abdominal: Soft. Bowel sounds are normal. She exhibits no distension. There is no tenderness. There is no rebound.  Musculoskeletal: Normal range of motion.  Neurological: She is alert and oriented to person, place, and time. No cranial nerve deficit.  Skin: Skin is warm and dry.  Psychiatric: She has a normal mood and affect. Judgment normal.  Vitals reviewed.  Assessment: 1. Post-menopausal bleeding   Need sampling to rule out hyperplasia or cancer Pt counseled on ability to remove ovaries by laparoscopy, but as cyst is stable and present for years, she prefers to leave alone.  I have had a careful discussion with this patient about all the options available and the risk/benefits of each. I have fully informed this patient that surgery may subject her to a variety of discomforts and risks: She understands that most patients have surgery with little difficulty, but problems can happen ranging from minor to fatal. These include nausea, vomiting, pain, bleeding, infection, poor healing, hernia, or formation of adhesions. Unexpected reactions may occur from any drug or anesthetic given. Unintended injury may occur to other pelvic or abdominal  structures such as Fallopian tubes, ovaries, bladder, ureter (tube from kidney to bladder), or bowel. Nerves going from the pelvis to the legs may be injured. Any such injury may require immediate or later additional surgery to correct the problem. Excessive blood loss requiring transfusion is very unlikely but possible. Dangerous blood clots may form in the legs or lungs. Physical and sexual activity will be restricted in varying degrees for an indeterminate period of time but most often 2-6 weeks.  Finally, she understands that it is impossible to list every possible undesirable effect and that the condition for which surgery is done is not always cured or significantly improved, and in rare cases may be even worse.Ample time was given to answer all questions.  Barnett Applebaum, MD, [facility], [facility] 11/08/2017  2:10 PM

## 2017-11-09 ENCOUNTER — Encounter
Admission: RE | Admit: 2017-11-09 | Discharge: 2017-11-09 | Disposition: A | Discharge status: Home / Self Care | Payer: Managed Care, Other (non HMO) | Source: Ambulatory Visit | Attending: Obstetrics & Gynecology | Admitting: Obstetrics & Gynecology

## 2017-11-09 DIAGNOSIS — Z01812 Encounter for preprocedural laboratory examination: Secondary | ICD-10-CM | POA: Diagnosis not present

## 2017-11-09 LAB — PROTIME-INR
INR: 0.86
Prothrombin Time: 11.6 seconds (ref 11.4–15.2)

## 2017-11-09 LAB — CBC
HEMATOCRIT: 41 % (ref 35.0–47.0)
Hemoglobin: 13.6 g/dL (ref 12.0–16.0)
MCH: 27.6 pg (ref 26.0–34.0)
MCHC: 33.1 g/dL (ref 32.0–36.0)
MCV: 83.6 fL (ref 80.0–100.0)
PLATELETS: 319 10*3/uL (ref 150–440)
RBC: 4.91 MIL/uL (ref 3.80–5.20)
RDW: 15 % — AB (ref 11.5–14.5)
WBC: 8.1 10*3/uL (ref 3.6–11.0)

## 2017-11-09 LAB — TYPE AND SCREEN
ABO/RH(D): O POS
ANTIBODY SCREEN: NEGATIVE

## 2017-11-09 LAB — POTASSIUM: POTASSIUM: 3.4 mmol/L — AB (ref 3.5–5.1)

## 2017-11-09 LAB — APTT: aPTT: 27 seconds (ref 24–36)

## 2017-11-14 ENCOUNTER — Telehealth: Payer: Self-pay

## 2017-11-14 NOTE — Telephone Encounter (Signed)
Pt is scheduled for surgery tomorrow. She thinks her allergies are affecting her ears. She is having a little vertigo. Wants to discuss w/RPH. [phone].

## 2017-11-15 ENCOUNTER — Encounter: Payer: Self-pay | Admitting: *Deleted

## 2017-11-15 ENCOUNTER — Ambulatory Visit: Payer: Managed Care, Other (non HMO) | Admitting: Certified Registered"

## 2017-11-15 ENCOUNTER — Ambulatory Visit
Admission: RE | Admit: 2017-11-15 | Discharge: 2017-11-15 | Disposition: A | Discharge status: Home / Self Care | Payer: Managed Care, Other (non HMO) | Source: Ambulatory Visit | Attending: Obstetrics & Gynecology | Admitting: Obstetrics & Gynecology

## 2017-11-15 ENCOUNTER — Encounter
Admission: RE | Disposition: A | Discharge status: Home / Self Care | Payer: Self-pay | Source: Ambulatory Visit | Attending: Obstetrics & Gynecology

## 2017-11-15 DIAGNOSIS — I10 Essential (primary) hypertension: Secondary | ICD-10-CM | POA: Diagnosis not present

## 2017-11-15 DIAGNOSIS — N840 Polyp of corpus uteri: Secondary | ICD-10-CM | POA: Diagnosis not present

## 2017-11-15 DIAGNOSIS — Z79899 Other long term (current) drug therapy: Secondary | ICD-10-CM | POA: Insufficient documentation

## 2017-11-15 DIAGNOSIS — K219 Gastro-esophageal reflux disease without esophagitis: Secondary | ICD-10-CM | POA: Diagnosis not present

## 2017-11-15 DIAGNOSIS — N950 Postmenopausal bleeding: Secondary | ICD-10-CM | POA: Diagnosis present

## 2017-11-15 DIAGNOSIS — G4700 Insomnia, unspecified: Secondary | ICD-10-CM | POA: Insufficient documentation

## 2017-11-15 DIAGNOSIS — F419 Anxiety disorder, unspecified: Secondary | ICD-10-CM | POA: Diagnosis not present

## 2017-11-15 DIAGNOSIS — Z85820 Personal history of malignant melanoma of skin: Secondary | ICD-10-CM | POA: Diagnosis not present

## 2017-11-15 HISTORY — PX: DILATION AND CURETTAGE OF UTERUS: SHX78

## 2017-11-15 LAB — ABO/RH: ABO/RH(D): O POS

## 2017-11-15 SURGERY — DILATION AND CURETTAGE
Anesthesia: Spinal

## 2017-11-15 MED ORDER — PHENYLEPHRINE HCL 10 MG/ML IJ SOLN
INTRAMUSCULAR | Status: AC
  Filled 2017-11-15: qty 1

## 2017-11-15 MED ORDER — BUPIVACAINE HCL (PF) 0.5 % IJ SOLN
INTRAMUSCULAR | Status: DC | PRN
  Administered 2017-11-15: 3 mL

## 2017-11-15 MED ORDER — PHENYLEPHRINE HCL 10 MG/ML IJ SOLN
INTRAMUSCULAR | Status: DC | PRN
  Administered 2017-11-15 (×3): 200 ug via INTRAVENOUS
  Administered 2017-11-15 (×2): 100 ug via INTRAVENOUS

## 2017-11-15 MED ORDER — ONDANSETRON HCL 4 MG/2ML IJ SOLN
INTRAMUSCULAR | Status: AC
  Filled 2017-11-15: qty 2

## 2017-11-15 MED ORDER — FENTANYL CITRATE (PF) 100 MCG/2ML IJ SOLN
INTRAMUSCULAR | Status: DC | PRN
  Administered 2017-11-15 (×2): 50 ug via INTRAVENOUS

## 2017-11-15 MED ORDER — MIDAZOLAM HCL 2 MG/2ML IJ SOLN
INTRAMUSCULAR | Status: DC | PRN
  Administered 2017-11-15: 2 mg via INTRAVENOUS

## 2017-11-15 MED ORDER — DEXAMETHASONE SODIUM PHOSPHATE 10 MG/ML IJ SOLN
INTRAMUSCULAR | Status: AC
  Filled 2017-11-15: qty 1

## 2017-11-15 MED ORDER — LACTATED RINGERS IV SOLN
INTRAVENOUS | Status: DC
  Administered 2017-11-15 (×2): via INTRAVENOUS

## 2017-11-15 MED ORDER — PROMETHAZINE HCL 25 MG/ML IJ SOLN: 6.2500 mg | INTRAMUSCULAR | Status: DC | PRN

## 2017-11-15 MED ORDER — OXYCODONE-ACETAMINOPHEN 5-325 MG PO TABS: 1.0000 | ORAL_TABLET | ORAL | 0 refills | Status: DC | PRN

## 2017-11-15 MED ORDER — LIDOCAINE HCL (CARDIAC) 20 MG/ML IV SOLN
INTRAVENOUS | Status: DC | PRN
  Administered 2017-11-15: 50 mg via INTRAVENOUS

## 2017-11-15 MED ORDER — ONDANSETRON HCL 4 MG/2ML IJ SOLN
INTRAMUSCULAR | Status: DC | PRN
  Administered 2017-11-15: 4 mg via INTRAVENOUS

## 2017-11-15 MED ORDER — MEPERIDINE HCL 50 MG/ML IJ SOLN
INTRAMUSCULAR | Status: AC
  Administered 2017-11-15: 12.5 mg via INTRAVENOUS
  Filled 2017-11-15: qty 1

## 2017-11-15 MED ORDER — GLYCOPYRROLATE 0.2 MG/ML IJ SOLN
INTRAMUSCULAR | Status: DC | PRN
  Administered 2017-11-15 (×2): 0.1 mg via INTRAVENOUS

## 2017-11-15 MED ORDER — PROPOFOL 500 MG/50ML IV EMUL
INTRAVENOUS | Status: DC | PRN
  Administered 2017-11-15: 50 ug/kg/min via INTRAVENOUS

## 2017-11-15 MED ORDER — MIDAZOLAM HCL 2 MG/2ML IJ SOLN
INTRAMUSCULAR | Status: AC
  Filled 2017-11-15: qty 2

## 2017-11-15 MED ORDER — LACTATED RINGERS IV BOLUS
500.0000 mL | Freq: Once | INTRAVENOUS | Status: AC
  Administered 2017-11-15: 500 mL via INTRAVENOUS

## 2017-11-15 MED ORDER — LIDOCAINE HCL (PF) 2 % IJ SOLN
INTRAMUSCULAR | Status: AC
  Filled 2017-11-15: qty 10

## 2017-11-15 MED ORDER — MEPERIDINE HCL 50 MG/ML IJ SOLN
12.5000 mg | Freq: Once | INTRAMUSCULAR | Status: AC
  Administered 2017-11-15: 12.5 mg via INTRAVENOUS

## 2017-11-15 MED ORDER — LACTATED RINGERS IV SOLN: INTRAVENOUS | Status: DC

## 2017-11-15 MED ORDER — PROPOFOL 10 MG/ML IV BOLUS
INTRAVENOUS | Status: AC
  Filled 2017-11-15: qty 20

## 2017-11-15 MED ORDER — BUPIVACAINE HCL (PF) 0.5 % IJ SOLN
INTRAMUSCULAR | Status: AC
Start: 2017-11-15 — End: 2017-11-15
  Filled 2017-11-15: qty 10

## 2017-11-15 MED ORDER — FENTANYL CITRATE (PF) 100 MCG/2ML IJ SOLN
INTRAMUSCULAR | Status: AC
  Filled 2017-11-15: qty 4

## 2017-11-15 MED ORDER — FENTANYL CITRATE (PF) 100 MCG/2ML IJ SOLN: 25.0000 ug | INTRAMUSCULAR | Status: DC | PRN

## 2017-11-15 MED ORDER — DEXAMETHASONE SODIUM PHOSPHATE 10 MG/ML IJ SOLN
INTRAMUSCULAR | Status: DC | PRN
  Administered 2017-11-15: 10 mg via INTRAVENOUS

## 2017-11-15 MED ORDER — GLYCOPYRROLATE 0.2 MG/ML IJ SOLN
INTRAMUSCULAR | Status: AC
  Filled 2017-11-15: qty 1

## 2017-11-15 SURGICAL SUPPLY — 13 items
BAG COUNTER SPONGE EZ (MISCELLANEOUS) ×2 IMPLANT
CATH ROBINSON RED A/P 16FR (CATHETERS) ×3 IMPLANT
COUNTER SPONGE BAG EZ (MISCELLANEOUS) ×1
GLOVE BIO SURGEON STRL SZ8 (GLOVE) ×9 IMPLANT
GOWN STRL REUS W/ TWL LRG LVL3 (GOWN DISPOSABLE) ×1 IMPLANT
GOWN STRL REUS W/ TWL XL LVL3 (GOWN DISPOSABLE) ×1 IMPLANT
GOWN STRL REUS W/TWL LRG LVL3 (GOWN DISPOSABLE) ×2
GOWN STRL REUS W/TWL XL LVL3 (GOWN DISPOSABLE) ×2
KIT TURNOVER CYSTO (KITS) ×3 IMPLANT
PACK DNC HYST (MISCELLANEOUS) ×3 IMPLANT
PAD OB MATERNITY 4.3X12.25 (PERSONAL CARE ITEMS) ×3 IMPLANT
PAD PREP 24X41 OB/GYN DISP (PERSONAL CARE ITEMS) ×3 IMPLANT
TOWEL OR 17X26 4PK STRL BLUE (TOWEL DISPOSABLE) ×3 IMPLANT

## 2017-11-15 NOTE — Discharge Instructions (Signed)
Dilation and Curettage or Vacuum Curettage, Care After °This sheet gives you information about how to care for yourself after your procedure. Your health care provider may also give you more specific instructions. If you have problems or questions, contact your health care provider. °What can I expect after the procedure? °After your procedure, it is common to have: °· Mild pain or cramping. °· Some vaginal bleeding or spotting. ° °These may last for up to 2 weeks after your procedure. °Follow these instructions at home: °Activity ° °· Do not drive or use heavy machinery while taking prescription pain medicine. °· Avoid driving for the first 24 hours after your procedure. °· Take frequent, short walks, followed by rest periods, throughout the day. Ask your health care provider what activities are safe for you. After 1-2 days, you may be able to return to your normal activities. °· Do not lift anything heavier than 10 lb (4.5 kg) until your health care provider approves. °· For at least 2 weeks, or as long as told by your health care provider, do not: °? Douche. °? Use tampons. °? Have sexual intercourse. °General instructions ° °· Take over-the-counter and prescription medicines only as told by your health care provider. This is especially important if you take blood thinning medicine. °· Do not take baths, swim, or use a hot tub until your health care provider approves. Take showers instead of baths. °· Wear compression stockings as told by your health care provider. These stockings help to prevent blood clots and reduce swelling in your legs. °· It is your responsibility to get the results of your procedure. Ask your health care provider, or the department performing the procedure, when your results will be ready. °· Keep all follow-up visits as told by your health care provider. This is important. °Contact a health care provider if: °· You have severe cramps that get worse or that do not get better with  medicine. °· You have severe abdominal pain. °· You cannot drink fluids without vomiting. °· You develop pain in a different area of your pelvis. °· You have bad-smelling vaginal discharge. °· You have a rash. °Get help right away if: °· You have vaginal bleeding that soaks more than one sanitary pad in 1 hour, for 2 hours in a row. °· You pass large blood clots from your vagina. °· You have a fever that is above 100.4°F (38.0°C). °· Your abdomen feels very tender or hard. °· You have chest pain. °· You have shortness of breath. °· You cough up blood. °· You feel dizzy or light-headed. °· You faint. °· You have pain in your neck or shoulder area. °This information is not intended to replace advice given to you by your health care provider. Make sure you discuss any questions you have with your health care provider. °Document Released: 07/16/2000 Document Revised: 03/17/2016 Document Reviewed: 02/19/2016 °Elsevier Interactive Patient Education © 2018 [employer] ° ° °AMBULATORY SURGERY  °DISCHARGE INSTRUCTIONS ° ° °1) The drugs that you were given will stay in your system until tomorrow so for the next 24 hours you should not: ° °A) Drive an automobile °B) Make any legal decisions °C) Drink any alcoholic beverage ° ° °2) You may resume regular meals tomorrow.  Today it is better to start with liquids and gradually work up to solid foods. ° °You may eat anything you prefer, but it is better to start with liquids, then soup and crackers, and gradually work up to solid foods. ° ° °  3) Please notify your doctor immediately if you have any unusual bleeding, trouble breathing, redness and pain at the surgery site, drainage, fever, or pain not relieved by medication. ° ° ° °4) Additional Instructions: ° ° ° ° ° ° ° °Please contact your physician with any problems or Same Day Surgery at [phone], Monday through Friday 6 am to 4 pm, or [facility] number at [phone]. ° °

## 2017-11-15 NOTE — Interval H&P Note (Signed)
History and Physical Interval Note:  11/15/2017 7:08 AM  Emily Burgess  has presented today for surgery, with the diagnosis of POST MENOPAUSAL BLEEDING  The various methods of treatment have been discussed with the patient and family. After consideration of risks, benefits and other options for treatment, the patient has consented to  Procedure(s): DILATATION AND CURETTAGE (N/A) as a surgical intervention .  The patient's history has been reviewed, patient examined, no change in status, stable for surgery.  I have reviewed the patient's chart and labs.  Questions were answered to the patient's satisfaction.     Hoyt Koch

## 2017-11-15 NOTE — Telephone Encounter (Signed)
You spoke with her right?

## 2017-11-15 NOTE — Anesthesia Preprocedure Evaluation (Signed)
Anesthesia Evaluation  Patient identified by MRN, date of birth, ID band Patient awake    Reviewed: Allergy & Precautions, H&P , NPO status , Patient's Chart, lab work & pertinent test results, reviewed documented beta blocker date and time   History of Anesthesia Complications (+) PROLONGED EMERGENCE and history of anesthetic complications  Airway Mallampati: I  TM Distance: >3 FB Neck ROM: full    Dental  (+) Dental Advidsory Given, Missing, Teeth Intact   Pulmonary neg pulmonary ROS,           Cardiovascular Exercise Tolerance: Good hypertension, (-) angina(-) CAD, (-) Past MI, (-) Cardiac Stents and (-) CABG (-) dysrhythmias + Valvular Problems/Murmurs      Neuro/Psych PSYCHIATRIC DISORDERS Anxiety negative neurological ROS     GI/Hepatic Neg liver ROS, hiatal hernia, GERD  ,  Endo/Other  negative endocrine ROS  Renal/GU negative Renal ROS  negative genitourinary   Musculoskeletal   Abdominal   Peds  Hematology negative hematology ROS (+)   Anesthesia Other Findings Past Medical History: No date: Anxiety No date: Complication of anesthesia     Comment:  HARD TO WAKE UP No date: Degenerative disc disease, lumbar No date: GERD (gastroesophageal reflux disease) No date: Headache     Comment:  H/O MIGRAINES No date: Heart murmur     Comment:  ASYMPTOMATIC No date: Heartburn No date: Hematuria     Comment:  microscopic No date: History of hiatal hernia     Comment:  SMALL No date: History of mammogram 05/30/2014: History of Papanicolaou smear of cervix     Comment:  -/- No date: Hypertension No date: Insomnia No date: Lumbago No date: Melanoma ([place])     Comment:  10 years ago resected from mid chest area.-MELANOMA 06/2015: Neck pain     Comment:  hurt hand in elevator at work which pt now has               tremendous neck pain.  Nerve block done in lower back.                Dr. Arvella Nigh 2007; 2012:  Ovarian cyst     Comment:  left ovarian mass No date: Vitamin D deficiency   Reproductive/Obstetrics negative OB ROS                             Anesthesia Physical Anesthesia Plan  ASA: II  Anesthesia Plan: Spinal   Post-op Pain Management:    Induction:   PONV Risk Score and Plan: 2 and Ondansetron and Dexamethasone  Airway Management Planned: Simple Face Mask  Additional Equipment:   Intra-op Plan:   Post-operative Plan:   Informed Consent: I have reviewed the patients History and Physical, chart, labs and discussed the procedure including the risks, benefits and alternatives for the proposed anesthesia with the patient or authorized representative who has indicated his/her understanding and acceptance.   Dental Advisory Given  Plan Discussed with: Anesthesiologist, CRNA and Surgeon  Anesthesia Plan Comments:         Anesthesia Quick Evaluation

## 2017-11-15 NOTE — Transfer of Care (Signed)
Immediate Anesthesia Transfer of Care Note  Patient: Emily Burgess  Procedure(s) Performed: DILATATION AND CURETTAGE (N/A )  Patient Location: PACU  Anesthesia Type:Spinal  Level of Consciousness: awake, alert  and oriented  Airway & Oxygen Therapy: Patient Spontanous Breathing and Patient connected to nasal cannula oxygen  Post-op Assessment: Report given to RN and Post -op Vital signs reviewed and stable  Post vital signs: Reviewed and stable  Last Vitals:  Vitals Value Taken Time  BP 72/52 11/15/2017  8:16 AM  Temp 36.3 C 11/15/2017  8:16 AM  Pulse 50 11/15/2017  8:17 AM  Resp 12 11/15/2017  8:17 AM  SpO2 99 % 11/15/2017  8:17 AM  Vitals shown include unvalidated device data.  Last Pain:  Vitals:   11/15/17 0610  TempSrc: Temporal  PainSc: 0-No pain         Complications: No apparent anesthesia complications

## 2017-11-15 NOTE — Progress Notes (Signed)
Chaplain provided prayer and mindfulness to help patient decrease her worry about the future. Chaplain offered, prayer, presence, and a special blessing of peace.

## 2017-11-15 NOTE — Anesthesia Postprocedure Evaluation (Signed)
Anesthesia Post Note  Patient: Emily Burgess  Procedure(s) Performed: DILATATION AND CURETTAGE (N/A )  Patient location during evaluation: PACU Anesthesia Type: Spinal Level of consciousness: oriented and awake and alert Pain management: pain level controlled Vital Signs Assessment: post-procedure vital signs reviewed and stable Respiratory status: spontaneous breathing, respiratory function stable and patient connected to nasal cannula oxygen Cardiovascular status: blood pressure returned to baseline and stable Postop Assessment: no headache, no backache and no apparent nausea or vomiting Anesthetic complications: no     Last Vitals:  Vitals:   11/15/17 1126 11/15/17 1211  BP: 113/84 (!) 105/56  Pulse: (!) 52   Resp: 16   Temp:    SpO2: 95%     Last Pain:  Vitals:   11/15/17 1211  TempSrc:   PainSc: 0-No pain                 Martha Clan

## 2017-11-15 NOTE — Anesthesia Post-op Follow-up Note (Signed)
Anesthesia QCDR form completed.        

## 2017-11-15 NOTE — Anesthesia Procedure Notes (Signed)
Spinal  Patient location during procedure: OR Start time: 11/15/2017 7:41 AM End time: 11/15/2017 7:47 AM Staffing Anesthesiologist: Martha Clan, MD Resident/CRNA: Nile Riggs, CRNA Performed: resident/CRNA  Preanesthetic Checklist Completed: patient identified, site marked, surgical consent, pre-op evaluation, timeout performed, IV checked, risks and benefits discussed and monitors and equipment checked Spinal Block Patient position: sitting Prep: ChloraPrep Location: L3-4 Injection technique: single-shot Needle Needle type: Pencan  Needle gauge: 24 G Assessment Sensory level: T4 Additional Notes Pt tolerated procedure

## 2017-11-15 NOTE — Op Note (Signed)
Operative Note  11/15/2017  PRE-OP DIAGNOSIS: Post Menopausal Bleeding, Endometrial Thickening  POST-OP DIAGNOSIS: Same   SURGEON: Barnett Applebaum, MD, FACOG  PROCEDURE: Procedure(s): DILATATION AND CURETTAGE   ANESTHESIA: Spinal  ESTIMATED BLOOD LOSS: Min, < 10 mL  SPECIMENS: ECC and EMC  COMPLICATIONS: None  DISPOSITION: PACU - hemodynamically stable.  CONDITION: stable  FINDINGS: Exam under anesthesia revealed small, mobile  uterus with no masses and bilateral adnexa without masses or fullness.   PROCEDURE IN DETAIL: After informed consent was obtained, the patient was taken to the operating room where anesthesia was obtained without difficulty. The patient was positioned in the dorsal lithotomy position in [place]. The patient's bladder was catheterized with an in and out foley catheter. The patient was examined under anesthesia, with the above noted findings. The weighted speculum was placed inside the patient's vagina, and the the anterior lip of the cervix was seen and grasped with the tenaculum.  An endocervical curettage was performed using a kevorkian curette.  The uterine cavity was sounded to 8cm, and then the cervix was progressively dilated to a 20 French-Pratt dilator. The uterine cavity was curetted until a gritty texture was noted, yielding specimen for endometrial curettings. Excellent hemostasis was noted, and all instruments were removed, with excellent hemostasis noted throughout. She was then taken out of dorsal lithotomy. The patient tolerated the procedure well. Sponge, lap and needle counts were correct x2. The patient was taken to recovery room in excellent condition.  Barnett Applebaum, MD, [facility], [facility] 11/15/2017  8:13 AM

## 2017-11-17 LAB — SURGICAL PATHOLOGY

## 2017-11-21 ENCOUNTER — Other Ambulatory Visit: Payer: Self-pay

## 2017-11-21 ENCOUNTER — Ambulatory Visit
Admission: EM | Admit: 2017-11-21 | Discharge: 2017-11-21 | Disposition: A | Discharge status: Home / Self Care | Payer: Managed Care, Other (non HMO) | Attending: Family Medicine | Admitting: Family Medicine

## 2017-11-21 ENCOUNTER — Telehealth: Payer: Self-pay

## 2017-11-21 DIAGNOSIS — L03317 Cellulitis of buttock: Secondary | ICD-10-CM

## 2017-11-21 MED ORDER — SULFAMETHOXAZOLE-TRIMETHOPRIM 800-160 MG PO TABS: 1.0000 | ORAL_TABLET | Freq: Two times a day (BID) | ORAL | 0 refills | Status: DC

## 2017-11-21 NOTE — Telephone Encounter (Signed)
Pt called reported that she had a DNC on 4/16, she is going to [employer], thinks she has a staff infection. She will call me and let me know what they say, she's aware RPH not in the office this week.

## 2017-11-21 NOTE — ED Provider Notes (Signed)
[facility]    CSN: [id number] Arrival date & time: 11/21/17  1034     History   Chief Complaint Chief Complaint  Patient presents with  . Skin Problem    HPI Emily Burgess is a 62 y.o. female.   62 yo female with a c/o painful red rash to left upper buttock for 5 days. States has felt feverish. Denies any injuries, insect bites, drainage. States she thinks that she picked up a staph infection from the hospital" where she was recently.   The history is provided by the patient.    Past Medical History:  Diagnosis Date  . Anxiety   . Complication of anesthesia    HARD TO WAKE UP  . Degenerative disc disease, lumbar   . GERD (gastroesophageal reflux disease)   . Headache    H/O MIGRAINES  . Heart murmur    ASYMPTOMATIC  . Heartburn   . Hematuria    microscopic  . History of hiatal hernia    SMALL  . History of mammogram   . History of Papanicolaou smear of cervix 05/30/2014   -/-  . Hypertension   . Insomnia   . Lumbago   . Melanoma ([place])    10 years ago resected from mid chest area.-MELANOMA  . Neck pain 06/2015   hurt hand in elevator at work which pt now has tremendous neck pain.  Nerve block done in lower back.  Dr. Arvella Nigh  . Ovarian cyst 2007; 2012   left ovarian mass  . Vitamin D deficiency     Patient Active Problem List   Diagnosis Date Noted  . Post-menopausal bleeding 10/07/2017  . Acid reflux 12/03/2015  . Microscopic hematuria 11/01/2015  . Adnexal cyst 11/01/2015  . Cyst of ovary 10/01/2015  . Avitaminosis D 12/12/2013  . BP (high blood pressure) 05/31/2013  . Rotator cuff syndrome 09/20/2012    Past Surgical History:  Procedure Laterality Date  . CESAREAN SECTION  1980  . DILATION AND CURETTAGE OF UTERUS N/A 11/15/2017   Procedure: DILATATION AND CURETTAGE;  Surgeon: Gae Dry, MD;  Location: [facility] ORS;  Service: Gynecology;  Laterality: N/A;  . ECTOPIC PREGNANCY SURGERY  1982-83  . elective abortion    . NECK  SURGERY  2016   fused and rod put in  . SALPINGECTOMY  1982   ectopic     OB History    Gravida  2   Para  1   Term  1   Preterm      AB  1   Living  1     SAB      TAB      Ectopic  1   Multiple      Live Births  1            Home Medications    Prior to Admission medications   Medication Sig Start Date End Date Taking? Authorizing Provider  acetaminophen (TYLENOL) 500 MG tablet Take 1,500-2,000 mg by mouth every 6 (six) hours as needed (FOR PAIN.).    [provider]  aspirin EC 81 MG tablet Take 81 mg by mouth at bedtime.     [provider]  atenolol (TENORMIN) 50 MG tablet TAKE 1 TABLET (50 MG TOTAL) BY MOUTH ONCE DAILY AT BEDTIME. 07/06/15   [provider]  ibuprofen (ADVIL,MOTRIN) 200 MG tablet Take 600-800 mg by mouth every 8 (eight) hours as needed (FOR PAIN.).     [provider]  lisinopril-hydrochlorothiazide (PRINZIDE,ZESTORETIC) 10-12.5 MG tablet Take 1 tablet by mouth at bedtime.  07/06/15   [provider]  oxyCODONE-acetaminophen (PERCOCET) 5-325 MG tablet Take 1 tablet by mouth every 4 (four) hours as needed for moderate pain or severe pain. 11/15/17   Gae Dry, MD  ranitidine (ZANTAC) 150 MG tablet Take 150 mg by mouth 2 (two) times daily as needed for heartburn.     [provider]  sulfamethoxazole-trimethoprim (BACTRIM DS,SEPTRA DS) 800-160 MG tablet Take 1 tablet by mouth 2 (two) times daily. 11/21/17   Norval Gable, MD    Family History Family History  Problem Relation Age of Onset  . Prostate cancer Cousin   . Hypertension Mother   . Diabetes Father        type 2  . Cancer Maternal Grandfather        bone   . Diabetes Paternal Grandmother        type 2  . Kidney disease Neg Hx   . Bladder Cancer Neg Hx   . Breast cancer Neg Hx     Social History Social History   Tobacco Use  . Smoking status: Never Smoker  . Smokeless tobacco: Never Used  Substance Use Topics    . Alcohol use: No    Alcohol/week: 0.0 oz  . Drug use: No     Allergies   Patient has no known allergies.   Review of Systems Review of Systems   Physical Exam Triage Vital Signs ED Triage Vitals  Enc Vitals Group     BP 11/21/17 1046 116/70     Pulse Rate 11/21/17 1046 73     Resp 11/21/17 1046 18     Temp 11/21/17 1046 98.4 F (36.9 C)     Temp Source 11/21/17 1046 Oral     SpO2 11/21/17 1046 99 %     Weight 11/21/17 1048 228 lb (103.4 kg)     Height 11/21/17 1048 5\' 4"  (1.626 m)     Head Circumference --      Peak Flow --      Pain Score 11/21/17 1048 9     Pain Loc --      Pain Edu? --      Excl. in [place]? --    No data found.  Updated Vital Signs BP 116/70 (BP Location: Right Arm)   Pulse 73   Temp 98.4 F (36.9 C) (Oral)   Resp 18   Ht 5\' 4"  (1.626 m)   Wt 228 lb (103.4 kg)   SpO2 99%   BMI 39.14 kg/m   Visual Acuity Right Eye Distance:   Left Eye Distance:   Bilateral Distance:    Right Eye Near:   Left Eye Near:    Bilateral Near:     Physical Exam  Constitutional: She appears well-developed and well-nourished. No distress.  Skin: Rash noted. She is not diaphoretic. There is erythema.  4x5 cm skin area on left upper buttock with blanchable erythema, warmth and tenderness to palpation as well as central pinpoint pustular lesions  Nursing note and vitals reviewed.    UC Treatments / Results  Labs (all labs ordered are listed, but only abnormal results are displayed) Labs Reviewed  AEROBIC CULTURE (SUPERFICIAL SPECIMEN)    EKG None Radiology No results found.  Procedures Procedures (including critical care time)  Medications Ordered in UC Medications - No data to display   Initial Impression / Assessment and Plan / UC Course  I  have reviewed the triage vital signs and the nursing notes.  Pertinent labs & imaging results that were available during my care of the patient were reviewed by me and considered in my medical decision  making (see chart for details).       Final Clinical Impressions(s) / UC Diagnoses   Final diagnoses:  Cellulitis of buttock    ED Discharge Orders        Ordered    sulfamethoxazole-trimethoprim (BACTRIM DS,SEPTRA DS) 800-160 MG tablet  2 times daily     11/21/17 1109     1. diagnosis reviewed with patient 2. Check wound culture 3.  rx as per orders above; reviewed possible side effects, interactions, risks and benefits  3. Recommend supportive treatment with warm compresses to area  4. Follow-up prn if symptoms worsen or don't improve  Controlled Substance Prescriptions [place] Controlled Substance Registry consulted? Not Applicable   Norval Gable, MD 11/21/17 [phone]

## 2017-11-21 NOTE — Telephone Encounter (Signed)
Spoke again with pt and this has nothing to due with her D&C she has a spot on her buttocks, pt states the er dr gave her antibiotic for 10 days

## 2017-11-21 NOTE — Discharge Instructions (Addendum)
Warm compresses to area Over the counter analgesics (tylenol/advil)

## 2017-11-21 NOTE — ED Triage Notes (Signed)
Pt reports "I have a staph infection I got from the hospital" to left buttock cheek. Pt reports she noticed it on 4/18. Unsure if it has drained. Pt also states she has some vertigo starting around the same time.

## 2017-11-26 LAB — AEROBIC CULTURE W GRAM STAIN (SUPERFICIAL SPECIMEN)
Culture: NO GROWTH
Special Requests: NORMAL

## 2017-11-28 ENCOUNTER — Encounter: Payer: Self-pay | Admitting: Obstetrics & Gynecology

## 2017-11-28 ENCOUNTER — Ambulatory Visit (INDEPENDENT_AMBULATORY_CARE_PROVIDER_SITE_OTHER): Payer: Managed Care, Other (non HMO) | Admitting: Obstetrics & Gynecology

## 2017-11-28 VITALS — BP 120/80 | Ht 64.0 in | Wt 226.0 lb

## 2017-11-28 DIAGNOSIS — N950 Postmenopausal bleeding: Secondary | ICD-10-CM

## 2017-11-28 DIAGNOSIS — N83202 Unspecified ovarian cyst, left side: Secondary | ICD-10-CM

## 2017-11-28 NOTE — Progress Notes (Signed)
  Postoperative Follow-up Patient presents post op from [facility] for PMB, 2 weeks ago. PATH: DIAGNOSIS:  A. ENDOCERVIX; CURETTAGE:  - SCANT ENDOCERVICAL EPITHELIUM AND SQUAMOUS EPITHELIUM.  - NEGATIVE FOR DYSPLASIA AND MALIGNANCY.   B. ENDOMETRIUM; CURETTAGE:  - ENDOMETRIAL POLYP FRAGMENTS.  - NEGATIVE FOR ATYPIA AND MALIGNANCY.   Subjective: Patient reports marked improvement in her preop symptoms. Eating a regular diet without difficulty. The patient is not having any pain.  Activity: normal activities of daily living. Patient reports vaginal sx's of None  Objective: BP 120/80   Ht 5\' 4"  (1.626 m)   Wt 226 lb (102.5 kg)   BMI 38.79 kg/m  Physical Exam  Constitutional: She is oriented to person, place, and time. She appears well-developed and well-nourished. No distress.  Musculoskeletal: Normal range of motion.  Neurological: She is alert and oriented to person, place, and time.  Skin: Skin is warm and dry.  Psychiatric: She has a normal mood and affect.  Vitals reviewed.   Assessment: s/p :  D&C stable  Plan: Patient has done well after surgery with no apparent complications.  I have discussed the post-operative course to date, and the expected progress moving forward.  The patient understands what complications to be concerned about.  I will see the patient in routine follow up, or sooner if needed.    Activity plan: No restriction. Monitor for bleeding  Hoyt Koch 11/28/2017, 4:06 PM

## 2017-12-01 ENCOUNTER — Ambulatory Visit: Payer: Managed Care, Other (non HMO) | Admitting: Obstetrics & Gynecology

## 2018-02-05 IMAGING — MG MM DIGITAL SCREENING BILAT W/ TOMO W/ CAD
8 of 13 series · 8 of 29 positions shown · non-contrast
Comparison: Previous exam(s).

CLINICAL DATA: Screening.

EXAM:
2D DIGITAL SCREENING BILATERAL MAMMOGRAM WITH CAD AND ADJUNCT TOMO

[R CC]
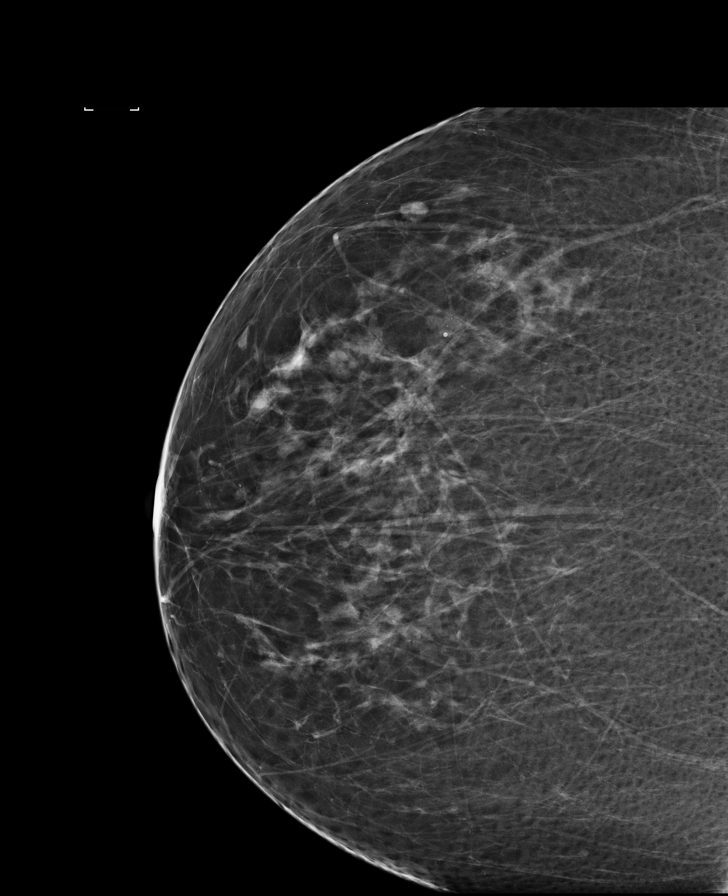

[R CC synth-2D]
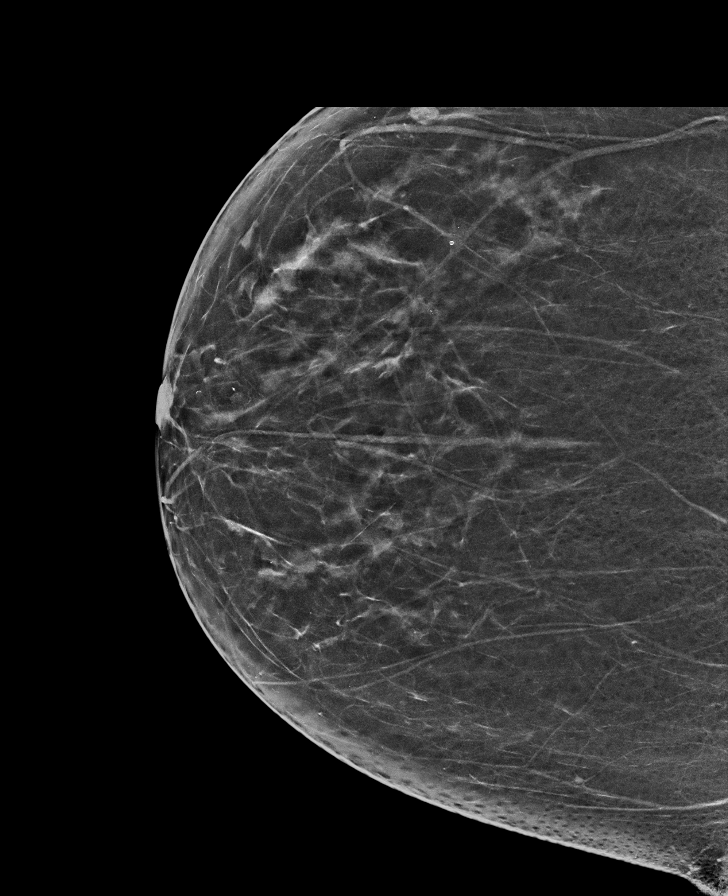

[L MLO]
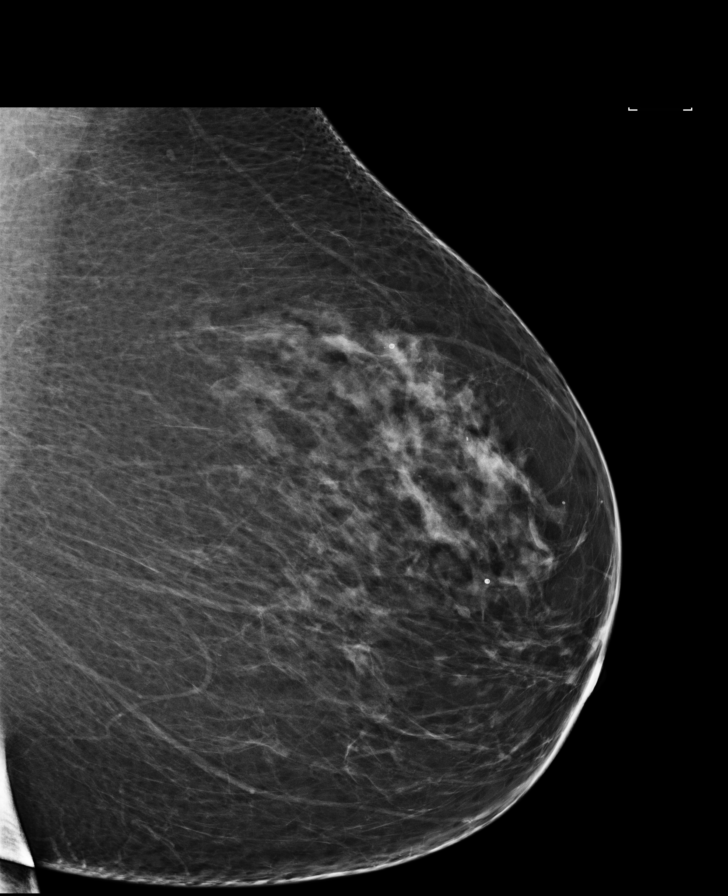

[R MLO synth-2D]
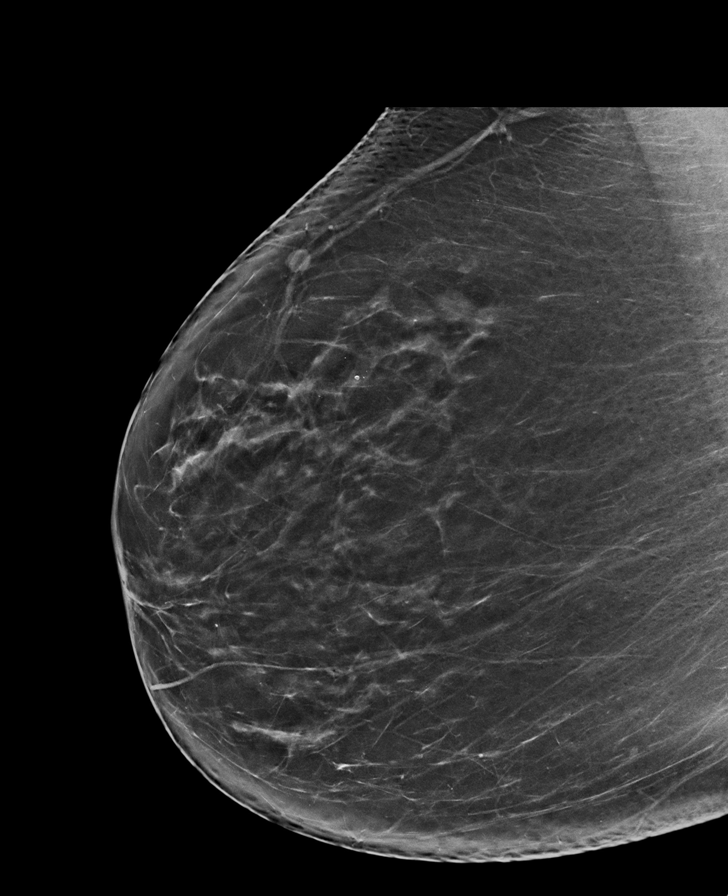

[L MLO synth-2D]
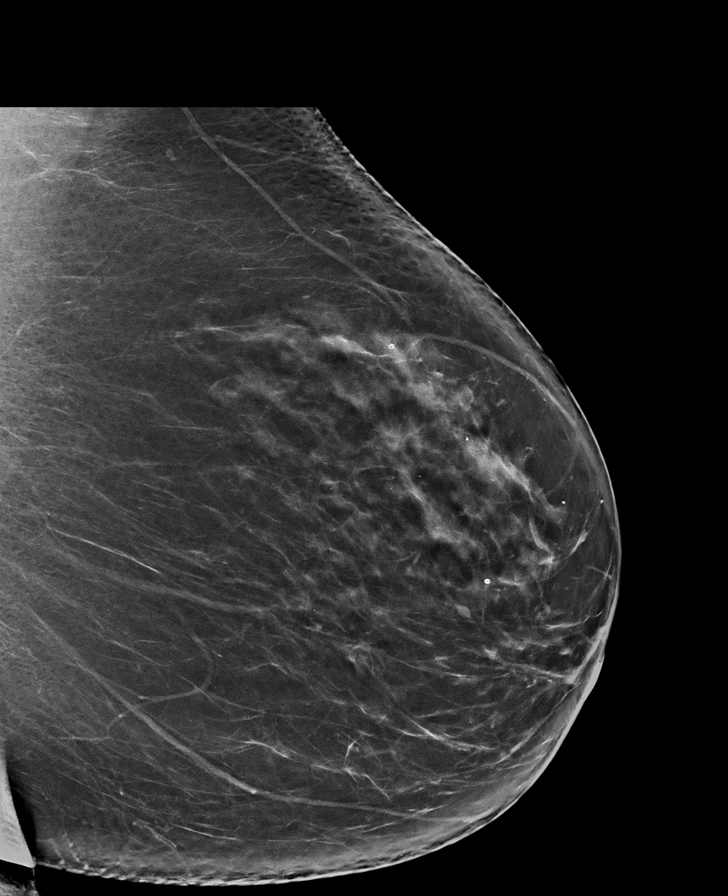

[L CC synth-2D]
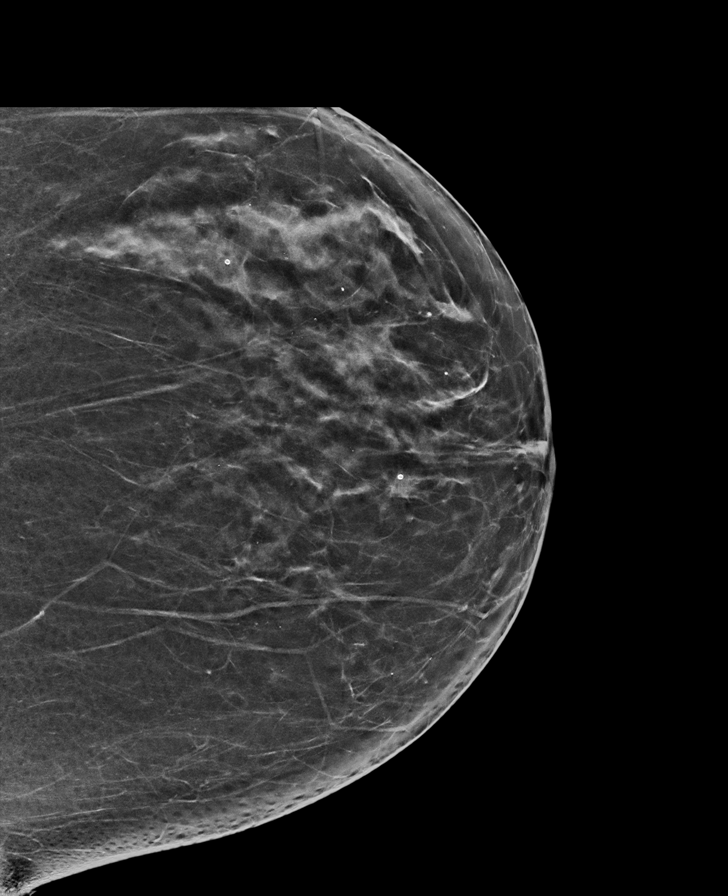

[L CC]
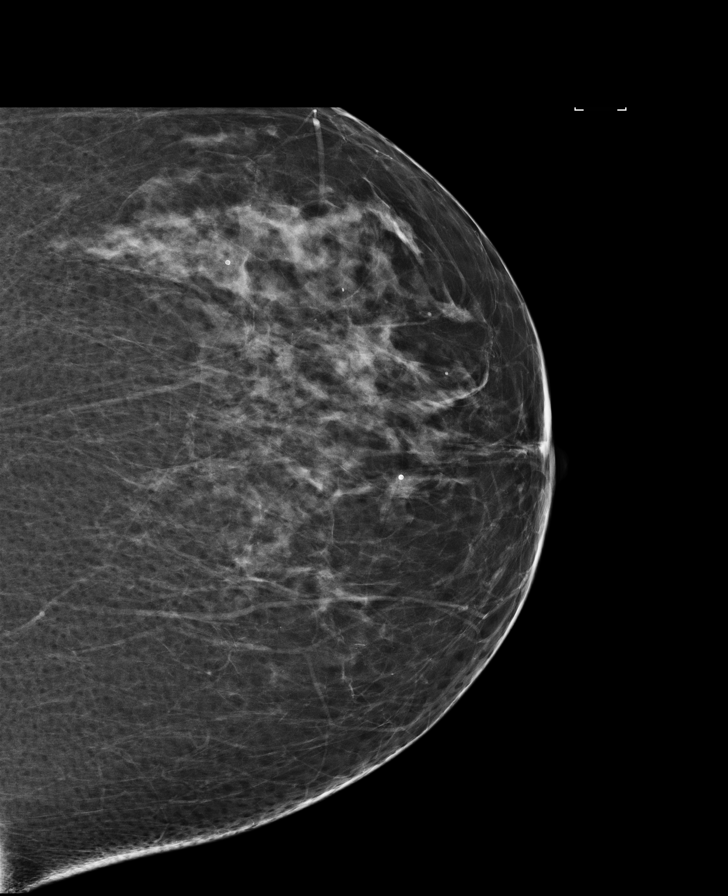

[R MLO]
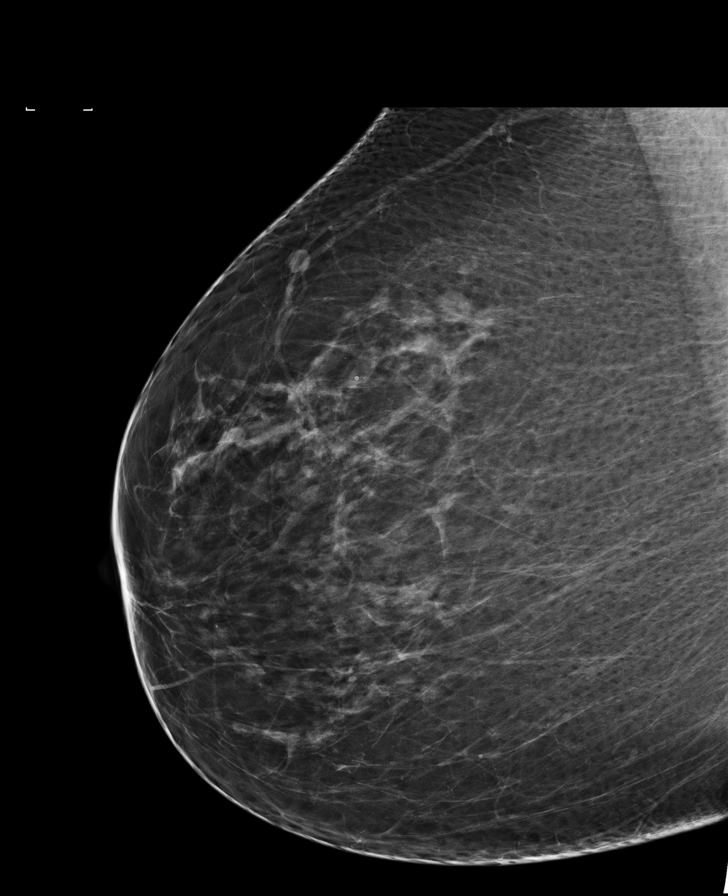

[8 of 29 positions shown; findings below may reference images not displayed]

ACR Breast Density Category b: There are scattered areas of
fibroglandular density.
FINDINGS: There are no findings suspicious for malignancy. Images were
processed with CAD.
IMPRESSION: No mammographic evidence of malignancy. A result letter of this
screening mammogram will be mailed directly to the patient.

RECOMMENDATION:
Screening mammogram in one year. (Code:97-6-RS4)

BI-RADS CATEGORY  1: Negative.

## 2018-06-02 ENCOUNTER — Other Ambulatory Visit: Payer: Self-pay | Admitting: Obstetrics and Gynecology

## 2018-06-02 ENCOUNTER — Other Ambulatory Visit: Payer: Self-pay | Admitting: Family Medicine

## 2018-06-02 DIAGNOSIS — Z1231 Encounter for screening mammogram for malignant neoplasm of breast: Secondary | ICD-10-CM

## 2018-07-04 ENCOUNTER — Ambulatory Visit: Payer: Managed Care, Other (non HMO)

## 2018-08-03 ENCOUNTER — Encounter: Payer: Self-pay | Admitting: Obstetrics and Gynecology

## 2018-08-03 ENCOUNTER — Ambulatory Visit
Admission: RE | Admit: 2018-08-03 | Discharge: 2018-08-03 | Disposition: A | Discharge status: Home / Self Care | Payer: Managed Care, Other (non HMO) | Source: Ambulatory Visit | Attending: Obstetrics and Gynecology | Admitting: Obstetrics and Gynecology

## 2018-08-03 ENCOUNTER — Encounter (INDEPENDENT_AMBULATORY_CARE_PROVIDER_SITE_OTHER): Payer: Self-pay

## 2018-08-03 DIAGNOSIS — Z1231 Encounter for screening mammogram for malignant neoplasm of breast: Secondary | ICD-10-CM | POA: Diagnosis present

## 2018-09-02 ENCOUNTER — Ambulatory Visit
Admission: EM | Admit: 2018-09-02 | Discharge: 2018-09-02 | Disposition: A | Discharge status: Home / Self Care | Payer: Managed Care, Other (non HMO) | Attending: Family Medicine | Admitting: Family Medicine

## 2018-09-02 ENCOUNTER — Other Ambulatory Visit: Payer: Self-pay

## 2018-09-02 ENCOUNTER — Encounter: Payer: Self-pay | Admitting: Emergency Medicine

## 2018-09-02 DIAGNOSIS — R197 Diarrhea, unspecified: Secondary | ICD-10-CM | POA: Diagnosis not present

## 2018-09-02 LAB — CBC WITH DIFFERENTIAL/PLATELET
Abs Immature Granulocytes: 0.03 10*3/uL (ref 0.00–0.07)
Basophils Absolute: 0.1 10*3/uL (ref 0.0–0.1)
Basophils Relative: 1 %
Eosinophils Absolute: 1.9 10*3/uL — ABNORMAL HIGH (ref 0.0–0.5)
Eosinophils Relative: 17 %
HEMATOCRIT: 43.1 % (ref 36.0–46.0)
HEMOGLOBIN: 14.1 g/dL (ref 12.0–15.0)
Immature Granulocytes: 0 %
LYMPHS ABS: 4.2 10*3/uL — AB (ref 0.7–4.0)
LYMPHS PCT: 39 %
MCH: 27.7 pg (ref 26.0–34.0)
MCHC: 32.7 g/dL (ref 30.0–36.0)
MCV: 84.7 fL (ref 80.0–100.0)
MONO ABS: 0.7 10*3/uL (ref 0.1–1.0)
Monocytes Relative: 7 %
Neutro Abs: 3.9 10*3/uL (ref 1.7–7.7)
Neutrophils Relative %: 36 %
Platelets: 342 10*3/uL (ref 150–400)
RBC: 5.09 MIL/uL (ref 3.87–5.11)
RDW: 13.8 % (ref 11.5–15.5)
WBC: 10.9 10*3/uL — AB (ref 4.0–10.5)
nRBC: 0 % (ref 0.0–0.2)

## 2018-09-02 LAB — COMPREHENSIVE METABOLIC PANEL
ALT: 23 U/L (ref 0–44)
AST: 28 U/L (ref 15–41)
Albumin: 4.2 g/dL (ref 3.5–5.0)
Alkaline Phosphatase: 80 U/L (ref 38–126)
Anion gap: 9 (ref 5–15)
BUN: 10 mg/dL (ref 8–23)
CO2: 27 mmol/L (ref 22–32)
CREATININE: 1.02 mg/dL — AB (ref 0.44–1.00)
Calcium: 9.2 mg/dL (ref 8.9–10.3)
Chloride: 100 mmol/L (ref 98–111)
GFR calc Af Amer: >60 mL/min (ref >60)
GFR, EST NON AFRICAN AMERICAN: 58 mL/min — AB (ref >60)
Glucose, Bld: 112 mg/dL — ABNORMAL HIGH (ref 70–99)
Potassium: 3.5 mmol/L (ref 3.5–5.1)
Sodium: 136 mmol/L (ref 135–145)
Total Bilirubin: 0.6 mg/dL (ref 0.3–1.2)
Total Protein: 8.3 g/dL — ABNORMAL HIGH (ref 6.5–8.1)

## 2018-09-02 LAB — LIPASE, BLOOD: Lipase: 38 U/L (ref 11–51)

## 2018-09-02 MED ORDER — DIPHENOXYLATE-ATROPINE 2.5-0.025 MG PO TABS: 1.0000 | ORAL_TABLET | Freq: Four times a day (QID) | ORAL | 0 refills | Status: DC | PRN

## 2018-09-02 NOTE — ED Triage Notes (Signed)
Patient c/o diarrhea for the past 2 weeks.

## 2018-09-02 NOTE — Discharge Instructions (Signed)
Medication as directed.  Return with stool sample.  If you worsen, go to the ER.  Take care  Dr. Lacinda Axon

## 2018-09-03 ENCOUNTER — Other Ambulatory Visit
Admission: RE | Admit: 2018-09-03 | Discharge: 2018-09-03 | Disposition: A | Discharge status: Home / Self Care | Payer: Managed Care, Other (non HMO) | Source: Ambulatory Visit | Attending: Family Medicine | Admitting: Family Medicine

## 2018-09-03 DIAGNOSIS — R197 Diarrhea, unspecified: Secondary | ICD-10-CM | POA: Insufficient documentation

## 2018-09-03 LAB — GASTROINTESTINAL PANEL BY PCR, STOOL (REPLACES STOOL CULTURE)
ADENOVIRUS F40/41: NOT DETECTED
Astrovirus: NOT DETECTED
CAMPYLOBACTER SPECIES: NOT DETECTED
CYCLOSPORA CAYETANENSIS: NOT DETECTED
Cryptosporidium: NOT DETECTED
ENTEROTOXIGENIC E COLI (ETEC): NOT DETECTED
Entamoeba histolytica: NOT DETECTED
Enteroaggregative E coli (EAEC): NOT DETECTED
Enteropathogenic E coli (EPEC): NOT DETECTED
Giardia lamblia: NOT DETECTED
Norovirus GI/GII: NOT DETECTED
PLESIMONAS SHIGELLOIDES: NOT DETECTED
Rotavirus A: NOT DETECTED
Salmonella species: NOT DETECTED
Sapovirus (I, II, IV, and V): NOT DETECTED
Shiga like toxin producing E coli (STEC): NOT DETECTED
Shigella/Enteroinvasive E coli (EIEC): NOT DETECTED
VIBRIO CHOLERAE: NOT DETECTED
VIBRIO SPECIES: NOT DETECTED
Yersinia enterocolitica: NOT DETECTED

## 2018-09-03 LAB — C DIFFICILE QUICK SCREEN W PCR REFLEX
C DIFFICLE (CDIFF) ANTIGEN: NEGATIVE
C Diff interpretation: NOT DETECTED
C Diff toxin: NEGATIVE

## 2018-09-03 NOTE — ED Provider Notes (Signed)
[facility]    CSN: [id number] Arrival date & time: 09/02/18  1600  History   Chief Complaint Chief Complaint  Patient presents with  . Diarrhea  SOB Leg pain  HPI   63 year old female presents with multiple complaints.  Patient's predominant concern is diarrhea.  She reports a 2-week history of diarrhea.  5-7 stools a day.  Patient reports she is not eating very much as it "runs right through her".  She reports associated upper abdominal pain.  Localizes it to the epigastric region.  Patient states that she has been taking Imodium without resolution.  Patient also reports she feels like she is short of breath.  No cough.  No fever.  This has been persistent for the past few days.  Uncertain etiology.  No recent respiratory illness.  Additionally, patient reports ongoing pain in her legs.  She states it starts in her buttocks and radiates downwards bilaterally.  She has known degenerative disc disease.  She states that this feels different.  Patient feels that this may be due to dehydration.  Patient has been able to drink has had a decrease in food intake.  Has a history of GI problems requiring work-up with endoscopy and colonoscopy.  PMH, Surgical Hx, Family Hx, Social History reviewed and updated as below.  Past Medical History:  Diagnosis Date  . Anxiety   . Complication of anesthesia    HARD TO WAKE UP  . Degenerative disc disease, lumbar   . GERD (gastroesophageal reflux disease)   . Headache    H/O MIGRAINES  . Heart murmur    ASYMPTOMATIC  . Heartburn   . Hematuria    microscopic  . History of hiatal hernia    SMALL  . History of mammogram   . History of Papanicolaou smear of cervix 05/30/2014   -/-  . Hypertension   . Insomnia   . Lumbago   . Melanoma ([place])    10 years ago resected from mid chest area.-MELANOMA  . Neck pain 06/2015   hurt hand in elevator at work which pt now has tremendous neck pain.  Nerve block done in lower back.  Dr.  Arvella Nigh  . Ovarian cyst 2007; 2012   left ovarian mass  . Vitamin D deficiency     Patient Active Problem List   Diagnosis Date Noted  . Post-menopausal bleeding 10/07/2017  . Acid reflux 12/03/2015  . Microscopic hematuria 11/01/2015  . Adnexal cyst 11/01/2015  . Cyst of ovary 10/01/2015  . Avitaminosis D 12/12/2013  . BP (high blood pressure) 05/31/2013  . Rotator cuff syndrome 09/20/2012    Past Surgical History:  Procedure Laterality Date  . CESAREAN SECTION  1980  . DILATION AND CURETTAGE OF UTERUS N/A 11/15/2017   Procedure: DILATATION AND CURETTAGE;  Surgeon: Gae Dry, MD;  Location: [facility] ORS;  Service: Gynecology;  Laterality: N/A;  . ECTOPIC PREGNANCY SURGERY  1982-83  . elective abortion    . NECK SURGERY  2016   fused and rod put in  . SALPINGECTOMY  1982   ectopic     OB History    Gravida  2   Para  1   Term  1   Preterm      AB  1   Living  1     SAB      TAB      Ectopic  1   Multiple      Live Births  1  Home Medications    Prior to Admission medications   Medication Sig Start Date End Date Taking? Authorizing Provider  aspirin EC 81 MG tablet Take 81 mg by mouth at bedtime.    Yes [provider]  atenolol (TENORMIN) 50 MG tablet TAKE 1 TABLET (50 MG TOTAL) BY MOUTH ONCE DAILY AT BEDTIME. 07/06/15  Yes [provider]  lisinopril-hydrochlorothiazide (PRINZIDE,ZESTORETIC) 10-12.5 MG tablet Take 1 tablet by mouth at bedtime.  07/06/15  Yes [provider]  acetaminophen (TYLENOL) 500 MG tablet Take 1,500-2,000 mg by mouth every 6 (six) hours as needed (FOR PAIN.).    [provider]  diphenoxylate-atropine (LOMOTIL) 2.5-0.025 MG tablet Take 1 tablet by mouth 4 (four) times daily as needed for diarrhea or loose stools. 09/02/18   Coral Spikes, DO  ibuprofen (ADVIL,MOTRIN) 200 MG tablet Take 600-800 mg by mouth every 8 (eight) hours as needed (FOR PAIN.).     [provider]    ranitidine (ZANTAC) 150 MG tablet Take 150 mg by mouth 2 (two) times daily as needed for heartburn.     [provider]    Family History Family History  Problem Relation Age of Onset  . Prostate cancer Cousin   . Hypertension Mother   . Diabetes Father        type 2  . Cancer Maternal Grandfather        bone   . Diabetes Paternal Grandmother        type 2  . Kidney disease Neg Hx   . Bladder Cancer Neg Hx   . Breast cancer Neg Hx     Social History Social History   Tobacco Use  . Smoking status: Never Smoker  . Smokeless tobacco: Never Used  Substance Use Topics  . Alcohol use: No    Alcohol/week: 0.0 standard drinks  . Drug use: No     Allergies   Patient has no known allergies.   Review of Systems Review of Systems  Respiratory: Positive for shortness of breath.   Gastrointestinal: Positive for abdominal pain and diarrhea.  Musculoskeletal:       Leg pain.   Physical Exam Triage Vital Signs ED Triage Vitals  Enc Vitals Group     BP 09/02/18 1622 132/90     Pulse Rate 09/02/18 1622 87     Resp 09/02/18 1622 16     Temp 09/02/18 1622 99.5 F (37.5 C)     Temp Source 09/02/18 1622 Oral     SpO2 09/02/18 1622 98 %     Weight 09/02/18 1620 225 lb 15.5 oz (102.5 kg)     Height 09/02/18 1620 5\' 4"  (1.626 m)     Head Circumference --      Peak Flow --      Pain Score 09/02/18 1619 0     Pain Loc --      Pain Edu? --      Excl. in [place]? --    Updated Vital Signs BP 132/90 (BP Location: Left Arm)   Pulse 87   Temp 99.5 F (37.5 C) (Oral)   Resp 16   Ht 5\' 4"  (1.626 m)   Wt 102.5 kg   SpO2 98%   BMI 38.79 kg/m   Visual Acuity Right Eye Distance:   Left Eye Distance:   Bilateral Distance:    Right Eye Near:   Left Eye Near:    Bilateral Near:     Physical Exam Vitals signs and nursing  note reviewed.  Constitutional:      General: She is not in acute distress.    Appearance: Normal appearance. She is obese.  HENT:     Head:  Normocephalic and atraumatic.     Mouth/Throat:     Mouth: Mucous membranes are moist.     Pharynx: Oropharynx is clear.  Eyes:     General:        Right eye: No discharge.        Left eye: No discharge.     Conjunctiva/sclera: Conjunctivae normal.  Cardiovascular:     Rate and Rhythm: Normal rate and regular rhythm.  Pulmonary:     Effort: Pulmonary effort is normal.     Breath sounds: Normal breath sounds. No wheezing, rhonchi or rales.  Abdominal:     Comments: Obese. Soft, nondistended. Mildly tender to palpation in the epigastric region.  Neurological:     Mental Status: She is alert.  Psychiatric:        Mood and Affect: Mood normal.        Behavior: Behavior normal.    UC Treatments / Results  Labs (all labs ordered are listed, but only abnormal results are displayed) Labs Reviewed  COMPREHENSIVE METABOLIC PANEL - Abnormal; Notable for the following components:      Result Value   Glucose, Bld 112 (*)    Creatinine, Ser 1.02 (*)    Total Protein 8.3 (*)    GFR calc non Af Amer 58 (*)    All other components within normal limits  CBC WITH DIFFERENTIAL/PLATELET - Abnormal; Notable for the following components:   WBC 10.9 (*)    Lymphs Abs 4.2 (*)    Eosinophils Absolute 1.9 (*)    All other components within normal limits  LIPASE, BLOOD    EKG None  Radiology No results found.  Procedures Procedures (including critical care time)  Medications Ordered in UC Medications - No data to display  Initial Impression / Assessment and Plan / UC Course  I have reviewed the triage vital signs and the nursing notes.  Pertinent labs & imaging results that were available during my care of the patient were reviewed by me and considered in my medical decision making (see chart for details).    63 year old female presents with multiple complaints.  Regarding her diarrhea, I am sure of the etiology.  Laboratory studies revealed slightly elevated WBC count of 10.9.   Elevated eosinophils and lymphocytes.  Electrolytes normal.  Advised hydration.  Will have patient return with stool sample for C. difficile and GI panel.  Lomotil as needed.  Regarding her shortness of breath, her exam is unremarkable.  No evidence of pneumonia or other respiratory issue at this time.  And lastly, her lower extremity pain is likely from the lumbar spine.  Supportive care.  Final Clinical Impressions(s) / UC Diagnoses   Final diagnoses:  Diarrhea, unspecified type     Discharge Instructions     Medication as directed.  Return with stool sample.  If you worsen, go to the ER.  Take care  Dr. Lacinda Axon    ED Prescriptions    Medication Sig Dispense Auth. Provider   diphenoxylate-atropine (LOMOTIL) 2.5-0.025 MG tablet Take 1 tablet by mouth 4 (four) times daily as needed for diarrhea or loose stools. 30 tablet Coral Spikes, DO     Controlled Substance Prescriptions [place] Controlled Substance Registry consulted? Not Applicable   Coral Spikes, [place] 09/03/18 3016

## 2018-09-27 ENCOUNTER — Encounter: Payer: Self-pay | Admitting: Obstetrics and Gynecology

## 2018-09-27 ENCOUNTER — Ambulatory Visit (INDEPENDENT_AMBULATORY_CARE_PROVIDER_SITE_OTHER): Payer: Managed Care, Other (non HMO) | Admitting: Obstetrics and Gynecology

## 2018-09-27 VITALS — BP 124/80 | HR 77 | Ht 64.0 in | Wt 230.0 lb

## 2018-09-27 DIAGNOSIS — Z23 Encounter for immunization: Secondary | ICD-10-CM | POA: Diagnosis not present

## 2018-09-27 DIAGNOSIS — Z01419 Encounter for gynecological examination (general) (routine) without abnormal findings: Secondary | ICD-10-CM | POA: Diagnosis not present

## 2018-09-27 DIAGNOSIS — Z1239 Encounter for other screening for malignant neoplasm of breast: Secondary | ICD-10-CM

## 2018-09-27 DIAGNOSIS — N950 Postmenopausal bleeding: Secondary | ICD-10-CM

## 2018-09-27 NOTE — Addendum Note (Signed)
Addended by: Drenda Freeze on: 09/27/2018 02:22 PM   Modules accepted: Orders

## 2018-09-27 NOTE — Patient Instructions (Signed)
I value your feedback and entrusting us with your care. If you get a Emily Burgess patient survey, I would appreciate you taking the time to let us know about your experience today. Thank you! 

## 2018-09-27 NOTE — Progress Notes (Signed)
Chief Complaint  Patient presents with  . Gynecologic Exam    wants flu vaccine    HPI:      Ms. Emily Burgess is a 63 y.o. G2P1011 who LMP was No LMP recorded. Patient is postmenopausal., presents today for her annual examination.  Her menses are absent due to menopause. She does not have intermenstrual bleeding. S/P D&C for PMB 4/19 with Dr. Kenton Kingfisher, sx resolved. Hx of stable LTO cyst on 4/19 u/s.  She has occas vasomotor sx.  Sex activity: not sexually active. She does not have vaginal dryness.  Last Pap: 02/08/17 Results were: no abnormalities /neg HPV DNA.  Hx of STDs: none  Last mammogram: 08/03/18  Results were: normal--routine follow-up in 12 months There is no FH of breast cancer. There is no FH of ovarian cancer. The patient does do self-breast exams.  Colonoscopy: 2019 without abnormalities, pt not sure when due again; sees Dr. Vira Agar   Tobacco use: The patient denies current or previous tobacco use. Alcohol use: none Exercise: not active  She does get adequate calcium and Vitamin D in her diet. She has labs with PCP.  Past Medical History:  Diagnosis Date  . Anxiety   . Complication of anesthesia    HARD TO WAKE UP  . Degenerative disc disease, lumbar   . GERD (gastroesophageal reflux disease)   . Headache    H/O MIGRAINES  . Heart murmur    ASYMPTOMATIC  . Heartburn   . Hematuria    microscopic  . History of hiatal hernia    SMALL  . History of mammogram   . History of Papanicolaou smear of cervix 05/30/2014   -/-  . Hypertension   . Insomnia   . Lumbago   . Melanoma ([place])    10 years ago resected from mid chest area.-MELANOMA  . Neck pain 06/2015   hurt hand in elevator at work which pt now has tremendous neck pain.  Nerve block done in lower back.  Dr. Arvella Nigh  . Ovarian cyst 2007; 2012   left ovarian mass  . Vitamin D deficiency     Past Surgical History:  Procedure Laterality Date  . CESAREAN SECTION  1980  . DILATION AND CURETTAGE OF  UTERUS N/A 11/15/2017   Procedure: DILATATION AND CURETTAGE;  Surgeon: Gae Dry, MD;  Location: [facility] ORS;  Service: Gynecology;  Laterality: N/A;  . ECTOPIC PREGNANCY SURGERY  1982-83  . elective abortion    . NECK SURGERY  2016   fused and rod put in  . SALPINGECTOMY  1982   ectopic     Family History  Problem Relation Age of Onset  . Prostate cancer Cousin   . Hypertension Mother   . Diabetes Father        type 2  . Cancer Maternal Grandfather        bone   . Diabetes Paternal Grandmother        type 2  . Kidney disease Neg Hx   . Bladder Cancer Neg Hx   . Breast cancer Neg Hx     Social History   Socioeconomic History  . Marital status: Single    Spouse name: Not on file  . Number of children: 1  . Years of education: 12  . Highest education level: Not on file  Occupational History  . Occupation: Engineer, structural  Social Needs  . Financial resource strain: Not on file  . Food insecurity:    Worry: Not  on file    Inability: Not on file  . Transportation needs:    Medical: Not on file    Non-medical: Not on file  Tobacco Use  . Smoking status: Never Smoker  . Smokeless tobacco: Never Used  Substance and Sexual Activity  . Alcohol use: No    Alcohol/week: 0.0 standard drinks  . Drug use: No  . Sexual activity: Not Currently    Birth control/protection: Post-menopausal  Lifestyle  . Physical activity:    Days per week: Not on file    Minutes per session: Not on file  . Stress: Not on file  Relationships  . Social connections:    Talks on phone: Not on file    Gets together: Not on file    Attends religious service: Not on file    Active member of club or organization: Not on file    Attends meetings of clubs or organizations: Not on file    Relationship status: Not on file  . Intimate partner violence:    Fear of current or ex partner: Not on file    Emotionally abused: Not on file    Physically abused: Not on file    Forced sexual activity:  Not on file  Other Topics Concern  . Not on file  Social History Narrative  . Not on file     Current Outpatient Medications:  .  acetaminophen (TYLENOL) 500 MG tablet, Take 1,500-2,000 mg by mouth every 6 (six) hours as needed (FOR PAIN.)., Disp: , Rfl:  .  aspirin EC 81 MG tablet, Take 81 mg by mouth at bedtime. , Disp: , Rfl:  .  atenolol (TENORMIN) 50 MG tablet, TAKE 1 TABLET (50 MG TOTAL) BY MOUTH ONCE DAILY AT BEDTIME., Disp: , Rfl: 2 .  lisinopril-hydrochlorothiazide (PRINZIDE,ZESTORETIC) 10-12.5 MG tablet, Take 1 tablet by mouth at bedtime. , Disp: , Rfl: 2   ROS:  Review of Systems  Constitutional: Positive for fatigue. Negative for fever and unexpected weight change.  Respiratory: Negative for cough, shortness of breath and wheezing.   Cardiovascular: Negative for chest pain, palpitations and leg swelling.  Gastrointestinal: Negative for blood in stool, constipation, diarrhea, nausea and vomiting.  Endocrine: Negative for cold intolerance, heat intolerance and polyuria.  Genitourinary: Negative for dyspareunia, dysuria, flank pain, frequency, genital sores, hematuria, menstrual problem, pelvic pain, urgency, vaginal bleeding, vaginal discharge and vaginal pain.  Musculoskeletal: Positive for back pain. Negative for arthralgias, joint swelling, myalgias, neck pain and neck stiffness.  Skin: Negative for rash.  Neurological: Negative for dizziness, syncope, light-headedness, numbness and headaches.  Hematological: Negative for adenopathy.  Psychiatric/Behavioral: Negative for agitation, confusion, sleep disturbance and suicidal ideas. The patient is not nervous/anxious.      Objective: BP 124/80   Pulse 77   Ht 5\' 4"  (1.626 m)   Wt 230 lb (104.3 kg)   BMI 39.48 kg/m    Physical Exam Constitutional:      Appearance: She is well-developed.  Genitourinary:     Vulva, vagina, uterus, right adnexa and left adnexa normal.     No vulval lesion or tenderness noted.      No vaginal discharge, erythema or tenderness.     No cervical motion tenderness or polyp.     Uterus is not enlarged or tender.     No right or left adnexal mass present.     Right adnexa not tender.     Left adnexa not tender.  Neck:     Musculoskeletal: Normal  range of motion.     Thyroid: No thyromegaly.  Cardiovascular:     Rate and Rhythm: Normal rate and regular rhythm.     Heart sounds: Normal heart sounds. No murmur.  Pulmonary:     Effort: Pulmonary effort is normal.     Breath sounds: Normal breath sounds.  Chest:     Breasts:        Right: No mass, nipple discharge, skin change or tenderness.        Left: No mass, nipple discharge, skin change or tenderness.  Abdominal:     Palpations: Abdomen is soft.     Tenderness: There is no abdominal tenderness. There is no guarding.  Musculoskeletal: Normal range of motion.  Neurological:     Mental Status: She is alert and oriented to person, place, and time.     Cranial Nerves: No cranial nerve deficit.  Psychiatric:        Behavior: Behavior normal.  Vitals signs reviewed.     Assessment/Plan: Encounter for annual routine gynecological examination  Screening for breast cancer - Pt current on mammo  PMB (postmenopausal bleeding) - Resolved after D&C 4019. F/u prn.         GYN counsel mammography screening, adequate intake of calcium and vitamin D     F/U  Return in about 1 year (around 09/28/2019).  Alicia B. Copland, PA-C 09/27/2018 2:16 PM

## 2020-01-30 ENCOUNTER — Other Ambulatory Visit: Payer: Self-pay | Admitting: Family Medicine

## 2020-01-30 DIAGNOSIS — Z780 Asymptomatic menopausal state: Secondary | ICD-10-CM

## 2020-01-30 DIAGNOSIS — Z1231 Encounter for screening mammogram for malignant neoplasm of breast: Secondary | ICD-10-CM

## 2020-04-10 ENCOUNTER — Ambulatory Visit
Admission: EM | Admit: 2020-04-10 | Discharge: 2020-04-10 | Disposition: A | Discharge status: Home / Self Care | Payer: Medicare HMO | Attending: Internal Medicine | Admitting: Internal Medicine

## 2020-04-10 ENCOUNTER — Ambulatory Visit (INDEPENDENT_AMBULATORY_CARE_PROVIDER_SITE_OTHER)
Admit: 2020-04-10 | Discharge: 2020-04-10 | Disposition: A | Discharge status: Home / Self Care | Payer: Medicare HMO | Attending: Internal Medicine | Admitting: Internal Medicine

## 2020-04-10 ENCOUNTER — Encounter: Payer: Self-pay | Admitting: Emergency Medicine

## 2020-04-10 ENCOUNTER — Other Ambulatory Visit: Payer: Self-pay

## 2020-04-10 DIAGNOSIS — I10 Essential (primary) hypertension: Secondary | ICD-10-CM | POA: Insufficient documentation

## 2020-04-10 DIAGNOSIS — R519 Headache, unspecified: Secondary | ICD-10-CM | POA: Insufficient documentation

## 2020-04-10 LAB — CBC WITH DIFFERENTIAL/PLATELET
Abs Immature Granulocytes: 0.05 10*3/uL (ref 0.00–0.07)
Basophils Absolute: 0.1 10*3/uL (ref 0.0–0.1)
Basophils Relative: 1 %
Eosinophils Absolute: 0.2 10*3/uL (ref 0.0–0.5)
Eosinophils Relative: 1 %
HCT: 42.8 % (ref 36.0–46.0)
Hemoglobin: 14 g/dL (ref 12.0–15.0)
Immature Granulocytes: 0 %
Lymphocytes Relative: 35 %
Lymphs Abs: 4 10*3/uL (ref 0.7–4.0)
MCH: 28.2 pg (ref 26.0–34.0)
MCHC: 32.7 g/dL (ref 30.0–36.0)
MCV: 86.1 fL (ref 80.0–100.0)
Monocytes Absolute: 1 10*3/uL (ref 0.1–1.0)
Monocytes Relative: 9 %
Neutro Abs: 6.3 10*3/uL (ref 1.7–7.7)
Neutrophils Relative %: 54 %
Platelets: 368 10*3/uL (ref 150–400)
RBC: 4.97 MIL/uL (ref 3.87–5.11)
RDW: 14.1 % (ref 11.5–15.5)
WBC: 11.5 10*3/uL — ABNORMAL HIGH (ref 4.0–10.5)
nRBC: 0 % (ref 0.0–0.2)

## 2020-04-10 LAB — COMPREHENSIVE METABOLIC PANEL
ALT: 25 U/L (ref 0–44)
AST: 24 U/L (ref 15–41)
Albumin: 4 g/dL (ref 3.5–5.0)
Alkaline Phosphatase: 55 U/L (ref 38–126)
Anion gap: 10 (ref 5–15)
BUN: 11 mg/dL (ref 8–23)
CO2: 29 mmol/L (ref 22–32)
Calcium: 9.2 mg/dL (ref 8.9–10.3)
Chloride: 101 mmol/L (ref 98–111)
Creatinine, Ser: 0.93 mg/dL (ref 0.44–1.00)
GFR calc Af Amer: >60 mL/min (ref >60)
GFR calc non Af Amer: >60 mL/min (ref >60)
Glucose, Bld: 96 mg/dL (ref 70–99)
Potassium: 4 mmol/L (ref 3.5–5.1)
Sodium: 140 mmol/L (ref 135–145)
Total Bilirubin: 0.6 mg/dL (ref 0.3–1.2)
Total Protein: 7.8 g/dL (ref 6.5–8.1)

## 2020-04-10 MED ORDER — TRAMADOL HCL 50 MG PO TABS: 50.0000 mg | ORAL_TABLET | Freq: Four times a day (QID) | ORAL | 0 refills | Status: AC | PRN

## 2020-04-10 NOTE — Discharge Instructions (Addendum)
The head cat scan is normal. Your blood work is all normal except for slight elevation of your white count which could be from pain and inflammation.   Follow up with your family Dr next week for your neck pain and headache.

## 2020-04-10 NOTE — ED Triage Notes (Signed)
Pt c/o headache in the front of her head. She states they have been going on for 2 weeks and getting worse. She states she feels foggy headed all the time. She states she had surgery on her neck about 2 years ago and the neck pain started before the headache. She states she was seen by her PCP and given prednisone and did not help. She also states she has dizziness.

## 2020-04-10 NOTE — ED Provider Notes (Signed)
[facility]    CSN: [id number] Arrival date & time: 04/10/20  1541      History   Chief Complaint Chief Complaint  Patient presents with  . Headache    HPI Emily Burgess is a 64 y.o. female with HA on top of her head x 2 weeks. Has been taking Tylenol which has not helped. She d/c coffee thinking it was from that. After this felt dizzy and then felt throbbing pain in her head worse than usual HA. She called her daughter and eased off to 1-2/12. She feels it was the worst HA she ever had. She gets migraines about once q 5 years.  For the past 2 weeks she has been having hot flushes not related to the HA intensity. Has finished prednisone 2 days ago which was given to her by PCP for her neck pain.   Past Medical History:  Diagnosis Date  . Anxiety   . Complication of anesthesia    HARD TO WAKE UP  . Degenerative disc disease, lumbar   . GERD (gastroesophageal reflux disease)   . Headache    H/O MIGRAINES  . Heart murmur    ASYMPTOMATIC  . Heartburn   . Hematuria    microscopic  . History of hiatal hernia    SMALL  . History of mammogram   . History of Papanicolaou smear of cervix 05/30/2014   -/-  . Hypertension   . Insomnia   . Lumbago   . Melanoma ([place])    10 years ago resected from mid chest area.-MELANOMA  . Neck pain 06/2015   hurt hand in elevator at work which pt now has tremendous neck pain.  Nerve block done in lower back.  Dr. Arvella Nigh  . Ovarian cyst 2007; 2012   left ovarian mass  . Vitamin D deficiency     Patient Active Problem List   Diagnosis Date Noted  . Post-menopausal bleeding 10/07/2017  . Acid reflux 12/03/2015  . Microscopic hematuria 11/01/2015  . Adnexal cyst 11/01/2015  . Cyst of ovary 10/01/2015  . Avitaminosis D 12/12/2013  . BP (high blood pressure) 05/31/2013  . Rotator cuff syndrome 09/20/2012    Past Surgical History:  Procedure Laterality Date  . CESAREAN SECTION  1980  . DILATION AND CURETTAGE OF UTERUS  N/A 11/15/2017   Procedure: DILATATION AND CURETTAGE;  Surgeon: Gae Dry, MD;  Location: [facility] ORS;  Service: Gynecology;  Laterality: N/A;  . ECTOPIC PREGNANCY SURGERY  1982-83  . elective abortion    . NECK SURGERY  2016   fused and rod put in  . SALPINGECTOMY  1982   ectopic     OB History    Gravida  2   Para  1   Term  1   Preterm      AB  1   Living  1     SAB      TAB      Ectopic  1   Multiple      Live Births  1            Home Medications    Prior to Admission medications   Medication Sig Start Date End Date Taking? Authorizing Provider  acetaminophen (TYLENOL) 500 MG tablet Take 1,500-2,000 mg by mouth every 6 (six) hours as needed (FOR PAIN.).   Yes [provider]  atenolol (TENORMIN) 50 MG tablet TAKE 1 TABLET (50 MG TOTAL) BY MOUTH ONCE DAILY AT BEDTIME. 07/06/15  Yes [provider]  lisinopril-hydrochlorothiazide (PRINZIDE,ZESTORETIC) 10-12.5 MG tablet Take 1 tablet by mouth at bedtime.  07/06/15  Yes [provider]  traMADol (ULTRAM) 50 MG tablet Take 1 tablet (50 mg total) by mouth every 6 (six) hours as needed. 04/10/20   Rodriguez-Southworth, Sunday Spillers, PA-C    Family History Family History  Problem Relation Age of Onset  . Prostate cancer Cousin   . Hypertension Mother   . Diabetes Father        type 2  . Cancer Maternal Grandfather        bone   . Diabetes Paternal Grandmother        type 2  . Kidney disease Neg Hx   . Bladder Cancer Neg Hx   . Breast cancer Neg Hx     Social History Social History   Tobacco Use  . Smoking status: Never Smoker  . Smokeless tobacco: Never Used  Vaping Use  . Vaping Use: Never used  Substance Use Topics  . Alcohol use: No    Alcohol/week: 0.0 standard drinks  . Drug use: No     Allergies   Patient has no known allergies.   Review of Systems Review of Systems  Constitutional: Negative for appetite change, chills, diaphoresis, fatigue and fever.    HENT: Negative for congestion, sinus pain and sore throat.   Eyes: Negative for discharge and visual disturbance.  Respiratory: Negative for cough, chest tightness and shortness of breath.   Cardiovascular: Negative for chest pain.  Gastrointestinal: Negative for nausea.  Endocrine:       Has trouble sleeping  Genitourinary: Negative for difficulty urinating and dyspareunia.  Musculoskeletal: Positive for neck pain. Negative for gait problem, myalgias and neck stiffness.  Skin: Negative for rash.  Neurological: Positive for dizziness and headaches. Negative for tremors and numbness.  Hematological: Negative for adenopathy.   Review of Systems  Constitutional: Negative for diaphoresis and unexpected weight change.  HENT: Negative for tinnitus.   Eyes: Negative for visual disturbance.  Respiratory: Negative for chest tightness and shortness of breath.   Cardiovascular: Negative for chest pain, palpitations and leg swelling.  Gastrointestinal: Negative for constipation, diarrhea and nausea.  Endocrine: Negative for polydipsia, polyphagia and polyuria.  Genitourinary: Negative for dysuria and frequency.  Skin: Negative for rash and wound.  Musculoskeletal- + neck pain Neurological: Negative for speech difficulty, weakness, numbness. + for HA and feeling shakie and off balance  Physical Exam Triage Vital Signs ED Triage Vitals  Enc Vitals Group     BP 04/10/20 1610 (!) 182/92     Pulse Rate 04/10/20 1610 75     Resp 04/10/20 1610 18     Temp 04/10/20 1610 98.2 F (36.8 C)     Temp Source 04/10/20 1610 Oral     SpO2 04/10/20 1610 96 %     Weight 04/10/20 1607 229 lb 15 oz (104.3 kg)     Height 04/10/20 1607 5\' 4"  (1.626 m)     Head Circumference --      Peak Flow --      Pain Score 04/10/20 1606 9     Pain Loc --      Pain Edu? --      Excl. in [place]? --    No data found.  Updated Vital Signs BP (!) 151/66 (BP Location: Left Arm)   Pulse 75   Temp 98.2 F (36.8 C) (Oral)    Resp 18   Ht 5\' 4"  (1.626 m)  Wt 229 lb 15 oz (104.3 kg)   SpO2 96%   BMI 39.47 kg/m   Visual Acuity Right Eye Distance:   Left Eye Distance:   Bilateral Distance:    Right Eye Near:   Left Eye Near:    Bilateral Near:     Physical Exam  Physical Exam Vitals signs and nursing note reviewed.  Constitutional:      General: He is not in acute distress.    Appearance: He is well-developed and normal weight. He is not ill-appearing, toxic-appearing or diaphoretic.  HENT:     Head: Normocephalic.  Eyes:     Extraocular Movements: Extraocular movements intact.     Pupils: Pupils are equal, round, and reactive to light.  Neck:     Musculoskeletal: Neck supple. No neck rigidity.     Meningeal: Brudzinski's sign absent.  Cardiovascular:     Rate and Rhythm: Normal rate and regular rhythm.     Heart sounds: No murmur.  Pulmonary:     Effort: Pulmonary effort is normal.     Breath sounds: Normal breath sounds. No wheezing, rhonchi or rales.  Abdominal:     General: Bowel sounds are normal.     Palpations: Abdomen is soft. There is no mass.     Tenderness: There is no abdominal tenderness. There is no guarding.  Musculoskeletal: Normal range of motion.  Lymphadenopathy:     Cervical: No cervical adenopathy.  Skin:    General: Skin is warm and dry.  Neurological:     Mental Status: He is alert.     Cranial Nerves: No cranial nerve deficit or facial asymmetry.     Sensory: No sensory deficit.     Motor: No weakness.     Coordination: Romberg sign negative. Coordination normal.     Gait: Gait normal.     Deep Tendon Reflexes: Reflexes normal.     Comments: Normal Romberg,,  finger to nose, tandem gait but was wobbly .   Psychiatric:        Mood and Affect: Mood normal.        Speech: Speech normal.        Behavior: Behavior normal.      UC Treatments / Results  Labs (all labs ordered are listed, but only abnormal results are displayed) Labs Reviewed  CBC WITH  DIFFERENTIAL/PLATELET - Abnormal; Notable for the following components:      Result Value   WBC 11.5 (*)    All other components within normal limits  COMPREHENSIVE METABOLIC PANEL    EKG   Radiology CT Head Wo Contrast  Result Date: 04/10/2020 CLINICAL DATA:  65 year old female with headache x2 weeks, progressive. Dizziness. EXAM: CT HEAD WITHOUT CONTRAST TECHNIQUE: Contiguous axial images were obtained from the base of the skull through the vertex without intravenous contrast. COMPARISON:  None. FINDINGS: Brain: Cerebral volume is within normal limits for age. No midline shift, ventriculomegaly, mass effect, evidence of mass lesion, intracranial hemorrhage or evidence of cortically based acute infarction. Minimal to mild for age cerebral white matter hypodensity, such as in the left middle frontal gyrus on series 2, image 17. Elsewhere normal gray-white matter differentiation. No cortical encephalomalacia. Vascular: Mild Calcified atherosclerosis at the skull base. No suspicious intracranial vascular hyperdensity. Skull: Negative. Sinuses/Orbits: Visualized paranasal sinuses and mastoids are clear. Other: Visualized orbits and scalp soft tissues are within normal limits. IMPRESSION: No acute intracranial abnormality. Negative for age noncontrast CT appearance of the brain. Electronically Signed   By: Lemmie Evens  [place] Crane M.D.   On: 04/10/2020 18:19    Procedures Procedures (including critical care time)  Medications Ordered in UC Medications - No data to display  Initial Impression / Assessment and Plan / UC Course  I have reviewed the triage vital signs and the nursing notes. Her HA is possibly related to her cervical disc disease.  Her CBC shows slight elevation of the white cell but could be from the prednisone and neck pain/ inflammation. CMP was normal.  HTN improved at discharge.  I placed her on Tramadol for her pain and needs to Fu with her PCP.  Pertinent labs & imaging results that were  available during my care of the patient were reviewed by me and considered in my medical decision making (see chart for details).  Final Clinical Impressions(s) / UC Diagnoses   Final diagnoses:  Bad headache  Essential hypertension  Intractable headache, unspecified chronicity pattern, unspecified headache type     Discharge Instructions     The head cat scan is normal. Your blood work is all normal except for slight elevation of your white count which could be from pain and inflammation.   Follow up with your family Dr next week for your neck pain and headache.     ED Prescriptions    Medication Sig Dispense Auth. Provider   traMADol (ULTRAM) 50 MG tablet Take 1 tablet (50 mg total) by mouth every 6 (six) hours as needed. 15 tablet Rodriguez-Southworth, Sunday Spillers, PA-C     I have reviewed the PDMP during this encounter.   Shelby Mattocks, [place] 04/10/20 2309

## 2021-01-16 ENCOUNTER — Other Ambulatory Visit: Payer: Self-pay | Admitting: Family Medicine

## 2021-01-16 DIAGNOSIS — Z780 Asymptomatic menopausal state: Secondary | ICD-10-CM

## 2021-01-16 DIAGNOSIS — Z1231 Encounter for screening mammogram for malignant neoplasm of breast: Secondary | ICD-10-CM

## 2021-12-14 ENCOUNTER — Other Ambulatory Visit: Payer: Self-pay | Admitting: Family Medicine

## 2021-12-14 DIAGNOSIS — Z780 Asymptomatic menopausal state: Secondary | ICD-10-CM

## 2021-12-15 ENCOUNTER — Other Ambulatory Visit: Payer: Self-pay | Admitting: Family Medicine

## 2021-12-15 DIAGNOSIS — Z1231 Encounter for screening mammogram for malignant neoplasm of breast: Secondary | ICD-10-CM

## 2021-12-24 ENCOUNTER — Encounter: Payer: Self-pay | Admitting: Emergency Medicine

## 2021-12-24 ENCOUNTER — Ambulatory Visit
Admission: EM | Admit: 2021-12-24 | Discharge: 2021-12-24 | Disposition: A | Discharge status: Home / Self Care | Payer: Managed Care, Other (non HMO)

## 2021-12-24 ENCOUNTER — Other Ambulatory Visit: Payer: Self-pay

## 2021-12-24 DIAGNOSIS — S46911A Strain of unspecified muscle, fascia and tendon at shoulder and upper arm level, right arm, initial encounter: Secondary | ICD-10-CM | POA: Diagnosis not present

## 2021-12-24 MED ORDER — BACLOFEN 10 MG PO TABS: 10.0000 mg | ORAL_TABLET | Freq: Three times a day (TID) | ORAL | 0 refills | Status: AC

## 2021-12-24 MED ORDER — IBUPROFEN 600 MG PO TABS: 600.0000 mg | ORAL_TABLET | Freq: Four times a day (QID) | ORAL | 0 refills | Status: AC | PRN

## 2021-12-24 NOTE — Discharge Instructions (Signed)
Take the ibuprofen, 600 mg every 6 hours with food, on a schedule for the next 48 hours and then as needed.  Take the baclofen, 10 mg every 8 hours, on a schedule for the next 48 hours and then as needed.  Apply moist heat to your neck & shoulder for 30 minutes at a time 2-3 times a day to improve blood flow to the area and help remove the lactic acid causing the spasm.  Follow the shoulder exercises given at discharge.  Massage therapy can be very effective at alleviating muscle tension and spasm.  If your symptoms do not improve you will need to follow-up with orthopedics.

## 2021-12-24 NOTE — ED Provider Notes (Signed)
[facility]    CSN: [id number] Arrival date & time: 12/24/21  1204      History   Chief Complaint Chief Complaint  Patient presents with   Shoulder Pain    HPI Emily Burgess is a 66 y.o. female.   HPI  66 year old female here for evaluation of right shoulder pain.  Patient reports that she has been experiencing pain in her right shoulder for the last 6 months.  She states that the pain will come down the right side of her neck into the back part of her shoulder and into her upper arm on the inside.  She describes the pain as being burning in nature.  She denies any injury.  She states that she does have a history of frozen shoulder for which she does home physical therapy exercises but they have not been helping.  She does endorse some pain going up the backside of her neck on the right side as well.  She has been using Tylenol without any improvement of pain.  Past Medical History:  Diagnosis Date   Anxiety    Complication of anesthesia    HARD TO WAKE UP   Degenerative disc disease, lumbar    GERD (gastroesophageal reflux disease)    Headache    H/O MIGRAINES   Heart murmur    ASYMPTOMATIC   Heartburn    Hematuria    microscopic   History of hiatal hernia    SMALL   History of mammogram    History of Papanicolaou smear of cervix 05/30/2014   -/-   Hypertension    Insomnia    Lumbago    Melanoma ([place])    10 years ago resected from mid chest area.-MELANOMA   Neck pain 06/2015   hurt hand in elevator at work which pt now has tremendous neck pain.  Nerve block done in lower back.  Dr. Arvella Nigh   Ovarian cyst 2007; 2012   left ovarian mass   Vitamin D deficiency     Patient Active Problem List   Diagnosis Date Noted   Post-menopausal bleeding 10/07/2017   Acid reflux 12/03/2015   Microscopic hematuria 11/01/2015   Adnexal cyst 11/01/2015   Cyst of ovary 10/01/2015   Avitaminosis D 12/12/2013   BP (high blood pressure) 05/31/2013   Rotator  cuff syndrome 09/20/2012    Past Surgical History:  Procedure Laterality Date   CESAREAN SECTION  1980   DILATION AND CURETTAGE OF UTERUS N/A 11/15/2017   Procedure: DILATATION AND CURETTAGE;  Surgeon: Gae Dry, MD;  Location: [facility] ORS;  Service: Gynecology;  Laterality: N/A;   ECTOPIC PREGNANCY SURGERY  1982-83   elective abortion     NECK SURGERY  2016   fused and rod put in   SALPINGECTOMY  1982   ectopic     OB History     Gravida  2   Para  1   Term  1   Preterm      AB  1   Living  1      SAB      IAB      Ectopic  1   Multiple      Live Births  1            Home Medications    Prior to Admission medications   Medication Sig Start Date End Date Taking? Authorizing Provider  acetaminophen (TYLENOL) 500 MG tablet Take 1,500-2,000 mg by mouth every 6 (six) hours  as needed (FOR PAIN.).   Yes [provider]  atenolol (TENORMIN) 50 MG tablet TAKE 1 TABLET (50 MG TOTAL) BY MOUTH ONCE DAILY AT BEDTIME. 07/06/15  Yes [provider]  baclofen (LIORESAL) 10 MG tablet Take 1 tablet (10 mg total) by mouth 3 (three) times daily. 12/24/21  Yes Margarette Canada, NP  ibuprofen (ADVIL) 600 MG tablet Take 1 tablet (600 mg total) by mouth every 6 (six) hours as needed. 12/24/21  Yes Margarette Canada, NP  lisinopril-hydrochlorothiazide (PRINZIDE,ZESTORETIC) 10-12.5 MG tablet Take 1 tablet by mouth at bedtime.  07/06/15  Yes [provider]  traMADol (ULTRAM) 50 MG tablet Take 1 tablet (50 mg total) by mouth every 6 (six) hours as needed. 04/10/20   Rodriguez-Southworth, Sunday Spillers, PA-C  zinc gluconate 3.75 mg/mL SOLN Take by mouth.    [provider]    Family History Family History  Problem Relation Age of Onset   Prostate cancer Cousin    Hypertension Mother    Diabetes Father        type 2   Cancer Maternal Grandfather        bone    Diabetes Paternal Grandmother        type 2   Kidney disease Neg Hx    Bladder Cancer Neg Hx     Breast cancer Neg Hx     Social History Social History   Tobacco Use   Smoking status: Never   Smokeless tobacco: Never  Vaping Use   Vaping Use: Never used  Substance Use Topics   Alcohol use: No    Alcohol/week: 0.0 standard drinks   Drug use: No     Allergies   Prednisone   Review of Systems Review of Systems  Musculoskeletal:  Positive for arthralgias, back pain and neck pain. Negative for joint swelling.  Skin:  Negative for color change.  Neurological:  Negative for weakness and numbness.  Hematological: Negative.   Psychiatric/Behavioral: Negative.      Physical Exam Triage Vital Signs ED Triage Vitals  Enc Vitals Group     BP 12/24/21 1302 109/81     Pulse Rate 12/24/21 1302 70     Resp 12/24/21 1302 16     Temp 12/24/21 1302 98 F (36.7 C)     Temp src --      SpO2 12/24/21 1302 97 %     Weight --      Height --      Head Circumference --      Peak Flow --      Pain Score 12/24/21 1300 10     Pain Loc --      Pain Edu? --      Excl. in [place]? --    No data found.  Updated Vital Signs BP 109/81 (BP Location: Left Arm)   Pulse 70   Temp 98 F (36.7 C)   Resp 16   SpO2 97%   Visual Acuity Right Eye Distance:   Left Eye Distance:   Bilateral Distance:    Right Eye Near:   Left Eye Near:    Bilateral Near:     Physical Exam Vitals and nursing note reviewed.  Constitutional:      Appearance: Normal appearance. She is not ill-appearing.  HENT:     Head: Normocephalic and atraumatic.  Musculoskeletal:        General: Tenderness present. No swelling, deformity or signs of injury.  Skin:    General: Skin is warm and  dry.     Capillary Refill: Capillary refill takes less than 2 seconds.     Findings: No bruising or erythema.  Neurological:     General: No focal deficit present.     Mental Status: She is alert and oriented to person, place, and time.  Psychiatric:        Mood and Affect: Mood normal.        Behavior: Behavior normal.         Thought Content: Thought content normal.        Judgment: Judgment normal.     UC Treatments / Results  Labs (all labs ordered are listed, but only abnormal results are displayed) Labs Reviewed - No data to display  EKG   Radiology No results found.  Procedures Procedures (including critical care time)  Medications Ordered in UC Medications - No data to display  Initial Impression / Assessment and Plan / UC Course  I have reviewed the triage vital signs and the nursing notes.  Pertinent labs & imaging results that were available during my care of the patient were reviewed by me and considered in my medical decision making (see chart for details).  Patient is a nontoxic-appearing 66 year old female here for evaluation of 6 months worth of right shoulder pain as outlined HPI above.  The pain is less in the shoulder joint as it is in the trapezius muscle in her upper back going into her neck.  Physical exam reveals a right shoulder that is in normal anatomical alignment.  Grips and right hand are 5/5 and right lower extremity strength is 5/5.  Patient does has limitation of range of motion with active internal rotation, or with extension across her body.  She can reach over her head, behind her back, and abduct her shoulder without any difficulty.  She has no tenderness with palpation of the bicep or tricep complex.  No tenderness with palpation of the deltoid or the glenohumeral joint of the shoulder.  No pain with palpation of the acromion process or clavicle.  She does have significant pain, tension, and tenderness in the body of the trapezius muscle in the superior aspect and along the right spinal border.  When palpating these areas it does reproduce the patient's pain.  I will treat her for muscle spasm and tension in her trapezius muscle with baclofen, ibuprofen, and home physical therapy.  I have also encouraged her to look into massage therapy to help alleviate the tension in  her muscle group which should help with her shoulder pain.  If these measures do not improve her pain she needs to follow-up with orthopedics.   Final Clinical Impressions(s) / UC Diagnoses   Final diagnoses:  Strain of right shoulder, initial encounter     Discharge Instructions      Take the ibuprofen, 600 mg every 6 hours with food, on a schedule for the next 48 hours and then as needed.  Take the baclofen, 10 mg every 8 hours, on a schedule for the next 48 hours and then as needed.  Apply moist heat to your neck & shoulder for 30 minutes at a time 2-3 times a day to improve blood flow to the area and help remove the lactic acid causing the spasm.  Follow the shoulder exercises given at discharge.  Massage therapy can be very effective at alleviating muscle tension and spasm.  If your symptoms do not improve you will need to follow-up with orthopedics.     ED  Prescriptions     Medication Sig Dispense Auth. Provider   ibuprofen (ADVIL) 600 MG tablet Take 1 tablet (600 mg total) by mouth every 6 (six) hours as needed. 30 tablet Margarette Canada, NP   baclofen (LIORESAL) 10 MG tablet Take 1 tablet (10 mg total) by mouth 3 (three) times daily. 61 each Margarette Canada, NP      PDMP not reviewed this encounter.   Margarette Canada, NP 12/24/21 [id number]

## 2021-12-24 NOTE — ED Triage Notes (Signed)
Pt c/o right shoulder pain x 6 months. Pt denies any injury or chest pain.

## 2022-01-19 ENCOUNTER — Ambulatory Visit: Payer: Medicare HMO

## 2022-01-19 ENCOUNTER — Other Ambulatory Visit: Payer: Medicare HMO

## 2022-02-11 ENCOUNTER — Ambulatory Visit
Admission: RE | Admit: 2022-02-11 | Discharge: 2022-02-11 | Disposition: A | Discharge status: Home / Self Care | Payer: Medicare HMO | Source: Ambulatory Visit | Attending: Family Medicine | Admitting: Family Medicine

## 2022-02-11 DIAGNOSIS — Z1231 Encounter for screening mammogram for malignant neoplasm of breast: Secondary | ICD-10-CM | POA: Insufficient documentation

## 2022-02-11 DIAGNOSIS — Z780 Asymptomatic menopausal state: Secondary | ICD-10-CM | POA: Insufficient documentation

## 2022-04-26 ENCOUNTER — Ambulatory Visit
Admission: EM | Admit: 2022-04-26 | Discharge: 2022-04-26 | Disposition: A | Discharge status: Home / Self Care | Payer: Medicare HMO

## 2022-04-26 DIAGNOSIS — S300XXA Contusion of lower back and pelvis, initial encounter: Secondary | ICD-10-CM

## 2022-04-26 DIAGNOSIS — M5416 Radiculopathy, lumbar region: Secondary | ICD-10-CM

## 2022-04-26 DIAGNOSIS — W19XXXA Unspecified fall, initial encounter: Secondary | ICD-10-CM

## 2022-04-26 NOTE — ED Triage Notes (Addendum)
Patient presents to [facility] for left leg pain from a fall on Friday. States she has pin and needle sensation to leg. Pt also following up for bilateral shoulder pain since 12/24/2021. She states she never followed up with ortho and the muscle relaxer did not help. Took ASA before coming in.

## 2022-04-26 NOTE — Discharge Instructions (Signed)
You will need an MRI and we cant order this here Follow up with an orthopedist at [facility]

## 2023-06-29 ENCOUNTER — Encounter: Payer: Self-pay | Admitting: Family Medicine

## 2023-06-29 ENCOUNTER — Ambulatory Visit
Admission: EM | Admit: 2023-06-29 | Discharge: 2023-06-29 | Disposition: A | Discharge status: Home / Self Care | Payer: Medicare HMO | Attending: Family Medicine | Admitting: Family Medicine

## 2023-06-29 ENCOUNTER — Ambulatory Visit: Payer: Medicare HMO

## 2023-06-29 DIAGNOSIS — R058 Other specified cough: Secondary | ICD-10-CM

## 2023-06-29 DIAGNOSIS — R0602 Shortness of breath: Secondary | ICD-10-CM

## 2023-06-29 MED ORDER — ALBUTEROL SULFATE HFA 108 (90 BASE) MCG/ACT IN AERS: 2.0000 | INHALATION_SPRAY | RESPIRATORY_TRACT | 0 refills | Status: AC | PRN

## 2023-06-29 MED ORDER — PROMETHAZINE-DM 6.25-15 MG/5ML PO SYRP: 5.0000 mL | ORAL_SOLUTION | Freq: Four times a day (QID) | ORAL | 0 refills | Status: AC | PRN

## 2023-06-29 MED ORDER — BENZONATATE 100 MG PO CAPS: 100.0000 mg | ORAL_CAPSULE | Freq: Three times a day (TID) | ORAL | 0 refills | Status: AC

## 2023-06-29 NOTE — ED Triage Notes (Signed)
Patient states that she had the flu 8 days ago and still having SOB.

## 2023-06-29 NOTE — ED Provider Notes (Addendum)
[facility]    CSN: [id number] Arrival date & time: 06/29/23  1640      History   Chief Complaint Chief Complaint  Patient presents with   Shortness of Breath    HPI Emily Burgess is a 67 y.o. female.   HPI  History obtained from the patient. Emily Burgess presents for shortness of breath after having the flu over a week ago. She had fevers but this resolved.  Has difficulty catching her breath when she is talking and with movement.  She stops to rest and the symptoms go away.  Denies chest pain, pressure or tightness.  Has some upper back pain. Continues to have productive cough that gets worse at night.  No rhinorrhea, nasal congestion, vomiting, nausea. Endorses diarrhea. Has been drinking a lot of liquids and eating soup.      Past Medical History:  Diagnosis Date   Anxiety    Complication of anesthesia    HARD TO WAKE UP   Degenerative disc disease, lumbar    GERD (gastroesophageal reflux disease)    Headache    H/O MIGRAINES   Heart murmur    ASYMPTOMATIC   Heartburn    Hematuria    microscopic   History of hiatal hernia    SMALL   History of mammogram    History of Papanicolaou smear of cervix 05/30/2014   -/-   Hypertension    Insomnia    Lumbago    Melanoma ([facility])    10 years ago resected from mid chest area.-MELANOMA   Neck pain 06/2015   hurt hand in elevator at work which pt now has tremendous neck pain.  Nerve block done in lower back.  Dr. Floyce Stakes   Ovarian cyst 2007; 2012   left ovarian mass   Vitamin D deficiency     Patient Active Problem List   Diagnosis Date Noted   Post-menopausal bleeding 10/07/2017   Acid reflux 12/03/2015   Microscopic hematuria 11/01/2015   Adnexal cyst 11/01/2015   Cyst of ovary 10/01/2015   Avitaminosis D 12/12/2013   BP (high blood pressure) 05/31/2013   Rotator cuff syndrome 09/20/2012    Past Surgical History:  Procedure Laterality Date   CESAREAN SECTION  1980   DILATION AND CURETTAGE OF  UTERUS N/A 11/15/2017   Procedure: DILATATION AND CURETTAGE;  Surgeon: Nadara Mustard, MD;  Location: [facility] ORS;  Service: Gynecology;  Laterality: N/A;   ECTOPIC PREGNANCY SURGERY  1982-83   elective abortion     NECK SURGERY  2016   fused and rod put in   SALPINGECTOMY  1982   ectopic     OB History     Gravida  2   Para  1   Term  1   Preterm      AB  1   Living  1      SAB      IAB      Ectopic  1   Multiple      Live Births  1            Home Medications    Prior to Admission medications   Medication Sig Start Date End Date Taking? Authorizing Provider  acetaminophen (TYLENOL) 500 MG tablet Take 1,500-2,000 mg by mouth every 6 (six) hours as needed (FOR PAIN.).   Yes [provider]  albuterol (VENTOLIN HFA) 108 (90 Base) MCG/ACT inhaler Inhale 2 puffs into the lungs every 4 (four) hours as needed. 06/29/23  Yes Talaysia Pinheiro, DO  atenolol (TENORMIN) 50 MG tablet TAKE 1 TABLET (50 MG TOTAL) BY MOUTH ONCE DAILY AT BEDTIME. 07/06/15  Yes [provider]  benzonatate (TESSALON) 100 MG capsule Take 1 capsule (100 mg total) by mouth every 8 (eight) hours. 06/29/23  Yes Juston Goheen, DO  lisinopril-hydrochlorothiazide (PRINZIDE,ZESTORETIC) 10-12.5 MG tablet Take 1 tablet by mouth at bedtime.  07/06/15  Yes [provider]  promethazine-dextromethorphan (PROMETHAZINE-DM) 6.25-15 MG/5ML syrup Take 5 mLs by mouth 4 (four) times daily as needed. 06/29/23  Yes Mireille Lacombe, DO  baclofen (LIORESAL) 10 MG tablet Take 1 tablet (10 mg total) by mouth 3 (three) times daily. 12/24/21   Becky Augusta, NP  ibuprofen (ADVIL) 600 MG tablet Take 1 tablet (600 mg total) by mouth every 6 (six) hours as needed. 12/24/21   Becky Augusta, NP  traMADol (ULTRAM) 50 MG tablet Take 1 tablet (50 mg total) by mouth every 6 (six) hours as needed. 04/10/20   Rodriguez-Southworth, Nettie Elm, PA-C  zinc gluconate 3.75 mg/mL SOLN Take by mouth.    [provider]    Family History Family History  Problem Relation Age of Onset   Prostate cancer Cousin    Hypertension Mother    Diabetes Father        type 2   Cancer Maternal Grandfather        bone    Diabetes Paternal Grandmother        type 2   Kidney disease Neg Hx    Bladder Cancer Neg Hx    Breast cancer Neg Hx     Social History Social History   Tobacco Use   Smoking status: Never   Smokeless tobacco: Never  Vaping Use   Vaping status: Never Used  Substance Use Topics   Alcohol use: No    Alcohol/week: 0.0 standard drinks of alcohol   Drug use: No     Allergies   Prednisone   Review of Systems Review of Systems: negative unless otherwise stated in HPI.      Physical Exam Triage Vital Signs ED Triage Vitals  Encounter Vitals Group     BP 06/29/23 1732 121/72     Systolic BP Percentile --      Diastolic BP Percentile --      Pulse Rate 06/29/23 1732 71     Resp 06/29/23 1732 20     Temp 06/29/23 1732 98.1 F (36.7 C)     Temp Source 06/29/23 1732 Oral     SpO2 06/29/23 1732 98 %     Weight --      Height --      Head Circumference --      Peak Flow --      Pain Score 06/29/23 1731 0     Pain Loc --      Pain Education --      Exclude from Growth Chart --    No data found.  Updated Vital Signs BP 121/72 (BP Location: Left Arm)   Pulse 71   Temp 98.1 F (36.7 C) (Oral)   Resp 20   SpO2 98%   Visual Acuity Right Eye Distance:   Left Eye Distance:   Bilateral Distance:    Right Eye Near:   Left Eye Near:    Bilateral Near:     Physical Exam GEN:     alert, non-toxic appearing female in no distress    HENT:  mucus membranes moist, no nasal discharge EYES:  pupils equal and reactive, no scleral injection or discharge RESP:  no increased work of breathing, clear to auscultation bilaterally CVS:   regular rate and rhythm, no JVP  Skin:   warm and dry, no rash on visible skin    UC Treatments / Results  Labs (all labs ordered are  listed, but only abnormal results are displayed) Labs Reviewed - No data to display  EKG   Radiology DG Chest 2 View  Result Date: 06/29/2023 CLINICAL DATA:  Shortness of breath. EXAM: CHEST - 2 VIEW COMPARISON:  07/13/2012 FINDINGS: The heart size and mediastinal contours are within normal limits. Both lungs are clear. Cervical spine fusion hardware noted. IMPRESSION: No active cardiopulmonary disease. Electronically Signed   By: Danae Orleans M.D.   On: 06/29/2023 18:23    Procedures Procedures (including critical care time)  Medications Ordered in UC Medications - No data to display  Initial Impression / Assessment and Plan / UC Course  I have reviewed the triage vital signs and the nursing notes.  Pertinent labs & imaging results that were available during my care of the patient were reviewed by me and considered in my medical decision making (see chart for details).       Pt is a 67 y.o. female who presents for 8 days of respiratory symptoms after having influenza.  Emily Burgess is afebrile here without recent antipyretics. Satting well on room air. Overall pt is non-toxic appearing, well hydrated, without respiratory distress. Pulmonary exam is unremarkable.  COVID, RSV and influenza deferred due to duration of symptoms.  EKG obtained and was showed normal sinus rhythm without acute ST or T wave changes.  Unchanged from EKG from July 13, 2012.  Chest xray personally reviewed by me without focal pneumonia, pleural effusion, cardiomegaly or pneumothorax. Radiologist impression reviewed.   History consistent with viral respiratory illness. Discussed symptomatic treatment.  Explained lack of efficacy of antibiotics in viral disease.  Typical duration of symptoms discussed.  Promethazine DM and Tessalon Perles for cough.  Albuterol for bronchospasm and shortness of breath.  Return and ED precautions given and voiced understanding. Discussed MDM, treatment plan and plan for follow-up with  patient who agrees with plan.     Final Clinical Impressions(s) / UC Diagnoses   Final diagnoses:  Shortness of breath  Post-viral cough syndrome     Discharge Instructions      Stop by the pharmacy to pick up your prescriptions.  Follow up with your primary care provider as needed.       ED Prescriptions     Medication Sig Dispense Auth. Provider   albuterol (VENTOLIN HFA) 108 (90 Base) MCG/ACT inhaler Inhale 2 puffs into the lungs every 4 (four) hours as needed. 6.7 g Emily Sonn, DO   promethazine-dextromethorphan (PROMETHAZINE-DM) 6.25-15 MG/5ML syrup Take 5 mLs by mouth 4 (four) times daily as needed. 118 mL Emily Waid, DO   benzonatate (TESSALON) 100 MG capsule Take 1 capsule (100 mg total) by mouth every 8 (eight) hours. 21 capsule Emily Cabal, DO      PDMP not reviewed this encounter.       Emily Cabal, DO 06/29/23 1849

## 2023-06-29 NOTE — Discharge Instructions (Signed)
Stop by the pharmacy to pick up your prescriptions.  Follow up with your primary care provider as needed.
